# Patient Record
Sex: Female | Born: 1945 | Race: Black or African American | Hispanic: No | Smoking: Former smoker
Health system: Southern US, Community
[De-identification: ages and names within clinical notes are randomized; demographics above are authoritative.]

## PROBLEM LIST (undated history)

## (undated) DIAGNOSIS — M545 Low back pain, unspecified: Secondary | ICD-10-CM

## (undated) DIAGNOSIS — M1990 Unspecified osteoarthritis, unspecified site: Secondary | ICD-10-CM

## (undated) DIAGNOSIS — I10 Essential (primary) hypertension: Secondary | ICD-10-CM

## (undated) DIAGNOSIS — I639 Cerebral infarction, unspecified: Secondary | ICD-10-CM

## (undated) DIAGNOSIS — F329 Major depressive disorder, single episode, unspecified: Secondary | ICD-10-CM

## (undated) DIAGNOSIS — F32A Depression, unspecified: Secondary | ICD-10-CM

## (undated) DIAGNOSIS — G8929 Other chronic pain: Secondary | ICD-10-CM

## (undated) DIAGNOSIS — G459 Transient cerebral ischemic attack, unspecified: Secondary | ICD-10-CM

## (undated) DIAGNOSIS — E119 Type 2 diabetes mellitus without complications: Secondary | ICD-10-CM

## (undated) DIAGNOSIS — E860 Dehydration: Secondary | ICD-10-CM

## (undated) DIAGNOSIS — G309 Alzheimer's disease, unspecified: Secondary | ICD-10-CM

## (undated) DIAGNOSIS — F0280 Dementia in other diseases classified elsewhere without behavioral disturbance: Secondary | ICD-10-CM

## (undated) DIAGNOSIS — E870 Hyperosmolality and hypernatremia: Secondary | ICD-10-CM

## (undated) DIAGNOSIS — F0390 Unspecified dementia without behavioral disturbance: Secondary | ICD-10-CM

## (undated) HISTORY — PX: PARTIAL KNEE ARTHROPLASTY: SHX2174

## (undated) HISTORY — PX: ABDOMINAL HYSTERECTOMY: SHX81

---

## 1997-12-18 ENCOUNTER — Ambulatory Visit (HOSPITAL_COMMUNITY): Admission: RE | Admit: 1997-12-18 | Discharge: 1997-12-18 | Payer: Self-pay | Admitting: *Deleted

## 1999-01-05 ENCOUNTER — Encounter: Payer: Self-pay | Admitting: Obstetrics and Gynecology

## 1999-01-05 ENCOUNTER — Ambulatory Visit (HOSPITAL_COMMUNITY): Admission: RE | Admit: 1999-01-05 | Discharge: 1999-01-05 | Payer: Self-pay | Admitting: Obstetrics and Gynecology

## 2000-01-23 ENCOUNTER — Ambulatory Visit (HOSPITAL_COMMUNITY): Admission: RE | Admit: 2000-01-23 | Discharge: 2000-01-23 | Payer: Self-pay | Admitting: Obstetrics and Gynecology

## 2000-01-23 ENCOUNTER — Encounter: Payer: Self-pay | Admitting: Obstetrics and Gynecology

## 2000-02-01 ENCOUNTER — Encounter: Admission: RE | Admit: 2000-02-01 | Discharge: 2000-02-01 | Payer: Self-pay | Admitting: Obstetrics and Gynecology

## 2000-02-01 ENCOUNTER — Encounter: Payer: Self-pay | Admitting: Obstetrics and Gynecology

## 2014-10-06 ENCOUNTER — Emergency Department (HOSPITAL_COMMUNITY): Payer: Medicare Other

## 2014-10-06 ENCOUNTER — Encounter (HOSPITAL_COMMUNITY): Payer: Self-pay | Admitting: Emergency Medicine

## 2014-10-06 ENCOUNTER — Emergency Department (HOSPITAL_COMMUNITY)
Admission: EM | Admit: 2014-10-06 | Discharge: 2014-10-06 | Disposition: A | Discharge status: Home / Self Care | Payer: Medicare Other | Attending: Emergency Medicine | Admitting: Emergency Medicine

## 2014-10-06 DIAGNOSIS — F0280 Dementia in other diseases classified elsewhere without behavioral disturbance: Secondary | ICD-10-CM | POA: Insufficient documentation

## 2014-10-06 DIAGNOSIS — R4182 Altered mental status, unspecified: Secondary | ICD-10-CM

## 2014-10-06 DIAGNOSIS — G309 Alzheimer's disease, unspecified: Secondary | ICD-10-CM | POA: Diagnosis not present

## 2014-10-06 DIAGNOSIS — I10 Essential (primary) hypertension: Secondary | ICD-10-CM | POA: Insufficient documentation

## 2014-10-06 DIAGNOSIS — F0390 Unspecified dementia without behavioral disturbance: Secondary | ICD-10-CM

## 2014-10-06 HISTORY — DX: Dementia in other diseases classified elsewhere, unspecified severity, without behavioral disturbance, psychotic disturbance, mood disturbance, and anxiety: F02.80

## 2014-10-06 HISTORY — DX: Unspecified dementia, unspecified severity, without behavioral disturbance, psychotic disturbance, mood disturbance, and anxiety: F03.90

## 2014-10-06 HISTORY — DX: Alzheimer's disease, unspecified: G30.9

## 2014-10-06 HISTORY — DX: Essential (primary) hypertension: I10

## 2014-10-06 LAB — COMPREHENSIVE METABOLIC PANEL
ALK PHOS: 46 U/L (ref 38–126)
ALT: 17 U/L (ref 14–54)
AST: 22 U/L (ref 15–41)
Albumin: 4.3 g/dL (ref 3.5–5.0)
Anion gap: 9 (ref 5–15)
BILIRUBIN TOTAL: 0.6 mg/dL (ref 0.3–1.2)
BUN: 25 mg/dL — ABNORMAL HIGH (ref 6–20)
CALCIUM: 10 mg/dL (ref 8.9–10.3)
CHLORIDE: 105 mmol/L (ref 101–111)
CO2: 28 mmol/L (ref 22–32)
Creatinine, Ser: 1.39 mg/dL — ABNORMAL HIGH (ref 0.44–1.00)
GFR calc non Af Amer: 37 mL/min — ABNORMAL LOW (ref >60)
GFR, EST AFRICAN AMERICAN: 43 mL/min — AB (ref >60)
GLUCOSE: 116 mg/dL — AB (ref 65–99)
POTASSIUM: 3.5 mmol/L (ref 3.5–5.1)
Sodium: 142 mmol/L (ref 135–145)
TOTAL PROTEIN: 8.2 g/dL — AB (ref 6.5–8.1)

## 2014-10-06 LAB — URINE MICROSCOPIC-ADD ON

## 2014-10-06 LAB — CBC
HCT: 38.9 % (ref 36.0–46.0)
HEMOGLOBIN: 12.5 g/dL (ref 12.0–15.0)
MCH: 28.2 pg (ref 26.0–34.0)
MCHC: 32.1 g/dL (ref 30.0–36.0)
MCV: 87.8 fL (ref 78.0–100.0)
PLATELETS: 258 10*3/uL (ref 150–400)
RBC: 4.43 MIL/uL (ref 3.87–5.11)
RDW: 13.2 % (ref 11.5–15.5)
WBC: 6.2 10*3/uL (ref 4.0–10.5)

## 2014-10-06 LAB — URINALYSIS, ROUTINE W REFLEX MICROSCOPIC
BILIRUBIN URINE: NEGATIVE
Glucose, UA: NEGATIVE mg/dL
Hgb urine dipstick: NEGATIVE
Ketones, ur: NEGATIVE mg/dL
Nitrite: NEGATIVE
PH: 7 (ref 5.0–8.0)
PROTEIN: NEGATIVE mg/dL
Specific Gravity, Urine: 1.021 (ref 1.005–1.030)
Urobilinogen, UA: 0.2 mg/dL (ref 0.0–1.0)

## 2014-10-06 LAB — CBG MONITORING, ED: Glucose-Capillary: 111 mg/dL — ABNORMAL HIGH (ref 65–99)

## 2014-10-06 LAB — PROTIME-INR
INR: 1 (ref 0.00–1.49)
PROTHROMBIN TIME: 13.4 s (ref 11.6–15.2)

## 2014-10-06 MED ORDER — CEPHALEXIN 500 MG PO CAPS
500.0000 mg | ORAL_CAPSULE | Freq: Once | ORAL | Status: AC
  Administered 2014-10-06: 500 mg via ORAL
  Filled 2014-10-06: qty 1

## 2014-10-06 MED ORDER — CEPHALEXIN 500 MG PO CAPS: 500.0000 mg | ORAL_CAPSULE | Freq: Three times a day (TID) | ORAL | Status: DC

## 2014-10-06 NOTE — ED Notes (Signed)
Pt is alert and talking with visitors at the bedside.  RN provided warm blanket per pt request.  No additional needs at this time.

## 2014-10-06 NOTE — ED Provider Notes (Signed)
CSN: [id number]     Arrival date & time 10/06/14  1114 History   First MD Initiated Contact with Patient 10/06/14 1124     Chief Complaint  Patient presents with  . Altered Mental Status    Level V caveat: Dementia  The history is provided by the patient.   patient presents to the emergency department complaining of altered mental status.  Patient has a history of dementia.  Family reports that she's had gradually increasing dementia and memory issues over the past year but it seems to worsen over the past 7-10 days.  No reports of vomiting or diarrhea.  No reports in change of appetite.  No recent falls or injuries.  Patient denies symptoms or pain at this time.  Past Medical History  Diagnosis Date  . Hypertension   . Alzheimer disease   . Dementia    No past surgical history on file. No family history on file. History  Substance Use Topics  . Smoking status: Not on file  . Smokeless tobacco: Not on file  . Alcohol Use: Not on file   OB History    No data available     Review of Systems  Unable to perform ROS     Allergies  Review of patient's allergies indicates no known allergies.  Home Medications   Prior to Admission medications   Not on File   There were no vitals taken for this visit. Physical Exam  Constitutional: She appears well-developed and well-nourished. No distress.  HENT:  Head: Normocephalic and atraumatic.  Eyes: EOM are normal.  Neck: Normal range of motion.  Cardiovascular: Normal rate, regular rhythm and normal heart sounds.   Pulmonary/Chest: Effort normal and breath sounds normal.  Abdominal: Soft. She exhibits no distension. There is no tenderness.  Musculoskeletal: Normal range of motion.  Neurological: She is alert.  Skin: Skin is warm and dry.  Psychiatric: She has a normal mood and affect. Judgment normal.  Nursing note and vitals reviewed.   ED Course  Procedures (including critical care time) Labs Review Labs Reviewed   COMPREHENSIVE METABOLIC PANEL - Abnormal; Notable for the following:    Glucose, Bld 116 (*)    BUN 25 (*)    Creatinine, Ser 1.39 (*)    Total Protein 8.2 (*)    GFR calc non Af Amer 37 (*)    GFR calc Af Amer 43 (*)    All other components within normal limits  URINALYSIS, ROUTINE W REFLEX MICROSCOPIC (NOT AT [facility]) - Abnormal; Notable for the following:    APPearance CLOUDY (*)    Leukocytes, UA SMALL (*)    All other components within normal limits  URINE MICROSCOPIC-ADD ON - Abnormal; Notable for the following:    Squamous Epithelial / LPF FEW (*)    Bacteria, UA MANY (*)    Casts HYALINE CASTS (*)    All other components within normal limits  CBG MONITORING, ED - Abnormal; Notable for the following:    Glucose-Capillary 111 (*)    All other components within normal limits  URINE CULTURE  CBC  PROTIME-INR    Imaging Review Dg Chest 2 View  10/06/2014   CLINICAL DATA:  69 year old female with altered level of consciousness. History of dementia and hypertension. Initial encounter.  EXAM: CHEST  2 VIEW  COMPARISON:  None.  FINDINGS: Poor inspiration.  Central pulmonary vascular prominence.  Limited evaluation lung bases on frontal view without evidence of infiltrate on lateral view.  Minimal nodularity  posterior to the sternum on the lateral view may represent vessels. Stability can be confirmed on follow-up in 6 months or further evaluated with CT at the current time.  No pneumothorax.  Heart slightly enlarged.  Mildly tortuous aorta.  Mild degenerative changes mid thoracic spine.  Bilateral shoulder joint degenerative changes.  IMPRESSION: Poor inspiration without evidence of segmental consolidation.  Central pulmonary vascular prominence without evidence of pulmonary edema.  Minimal nodularity posterior to the sternum on the lateral view may represent vessels. Stability can be confirmed on follow-up in 6 months or further evaluated with CT at the current time.  Tortuous aorta.    Electronically Signed   By: Lacy Duverney M.D.   On: 10/06/2014 12:43   Ct Head Wo Contrast  10/06/2014   CLINICAL DATA:  Altered mental status. History of Alzheimer's dementia  EXAM: CT HEAD WITHOUT CONTRAST  TECHNIQUE: Contiguous axial images were obtained from the base of the skull through the vertex without intravenous contrast.  COMPARISON:  Brain MRI January 06, 2013  FINDINGS: Moderate diffuse atrophy is stable. There is no intracranial mass, hemorrhage, extra-axial fluid collection, or midline shift. There is evidence of a prior infarct in the mid right frontal lobe, unchanged. There is small vessel disease, patchy, throughout the centra semiovale bilaterally. No new gray-white lesion is identified. No acute infarct appreciable. The bony calvarium appears intact. The mastoid air cells are clear. 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  IMPRESSION: Moderate diffuse atrophy with moderate periventricular small vessel disease in the centra semiovale bilaterally. 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 No acute infarct evident. Chronic right maxillary sinus disease.   Electronically Signed   By: Bretta Bang III M.D.   On: 10/06/2014 12:44  I personally reviewed the imaging tests through PACS system I reviewed available ER/hospitalization records through the EMR    EKG Interpretation None      MDM   Final diagnoses:  None    Overall the patient is well-appearing.  Her urine does show some bacteria.  Urine culture will be sent.  Patient be covered with Keflex.  I suspect however more this is worsening and accelerating dementia.  She was recently placed in a new nursing facility 6-8 weeks ago.  It's likely venous change in location that is caused increasing confusion.  She has scheduled follow-up with neurology.  I recommended that the family speak with primary care physician.  I do not think she needs additional testing at this time.  She is overall very well  appearing.    Azalia Bilis, MD 10/06/14 [phone]

## 2014-10-06 NOTE — ED Notes (Signed)
Pt presents via EMS after report from assisted living facility that she has altered mental status.  EMS reports that she has not taken any of her medications for at least two days per staff at the facility.  Pt has hx of dementia and alzheimers and is currently alert and oriented to self only.  She is unsure of how she feels but states that she is not currently in pain.  Pt has strong bilateral hand grips, strong flexion and extension of feet and legs.  No sensory deficits noted except pt is wearing glasses.

## 2014-10-08 LAB — URINE CULTURE

## 2015-08-27 ENCOUNTER — Emergency Department (HOSPITAL_COMMUNITY): Payer: Medicare Other

## 2015-08-27 ENCOUNTER — Encounter (HOSPITAL_COMMUNITY): Payer: Self-pay

## 2015-08-27 ENCOUNTER — Observation Stay (HOSPITAL_COMMUNITY): Payer: Medicare Other

## 2015-08-27 ENCOUNTER — Inpatient Hospital Stay (HOSPITAL_COMMUNITY)
Admission: EM | Admit: 2015-08-27 | Discharge: 2015-09-02 | DRG: 064 | Disposition: A | Discharge status: Skilled Nursing Facility | Payer: Medicare Other | Attending: Student in an Organized Health Care Education/Training Program | Admitting: Student in an Organized Health Care Education/Training Program

## 2015-08-27 DIAGNOSIS — F0280 Dementia in other diseases classified elsewhere without behavioral disturbance: Secondary | ICD-10-CM | POA: Diagnosis present

## 2015-08-27 DIAGNOSIS — I639 Cerebral infarction, unspecified: Secondary | ICD-10-CM

## 2015-08-27 DIAGNOSIS — F0390 Unspecified dementia without behavioral disturbance: Secondary | ICD-10-CM | POA: Insufficient documentation

## 2015-08-27 DIAGNOSIS — I6339 Cerebral infarction due to thrombosis of other cerebral artery: Secondary | ICD-10-CM | POA: Diagnosis not present

## 2015-08-27 DIAGNOSIS — Z791 Long term (current) use of non-steroidal anti-inflammatories (NSAID): Secondary | ICD-10-CM

## 2015-08-27 DIAGNOSIS — I63412 Cerebral infarction due to embolism of left middle cerebral artery: Principal | ICD-10-CM | POA: Diagnosis present

## 2015-08-27 DIAGNOSIS — G8191 Hemiplegia, unspecified affecting right dominant side: Secondary | ICD-10-CM | POA: Diagnosis present

## 2015-08-27 DIAGNOSIS — G309 Alzheimer's disease, unspecified: Secondary | ICD-10-CM | POA: Diagnosis present

## 2015-08-27 DIAGNOSIS — I10 Essential (primary) hypertension: Secondary | ICD-10-CM | POA: Diagnosis present

## 2015-08-27 DIAGNOSIS — Z7189 Other specified counseling: Secondary | ICD-10-CM | POA: Insufficient documentation

## 2015-08-27 DIAGNOSIS — Z515 Encounter for palliative care: Secondary | ICD-10-CM | POA: Insufficient documentation

## 2015-08-27 DIAGNOSIS — R1314 Dysphagia, pharyngoesophageal phase: Secondary | ICD-10-CM | POA: Diagnosis present

## 2015-08-27 DIAGNOSIS — R414 Neurologic neglect syndrome: Secondary | ICD-10-CM | POA: Diagnosis present

## 2015-08-27 DIAGNOSIS — E119 Type 2 diabetes mellitus without complications: Secondary | ICD-10-CM | POA: Diagnosis present

## 2015-08-27 DIAGNOSIS — E785 Hyperlipidemia, unspecified: Secondary | ICD-10-CM | POA: Diagnosis present

## 2015-08-27 DIAGNOSIS — I619 Nontraumatic intracerebral hemorrhage, unspecified: Secondary | ICD-10-CM | POA: Diagnosis present

## 2015-08-27 DIAGNOSIS — Z66 Do not resuscitate: Secondary | ICD-10-CM | POA: Insufficient documentation

## 2015-08-27 HISTORY — DX: Unspecified osteoarthritis, unspecified site: M19.90

## 2015-08-27 HISTORY — DX: Depression, unspecified: F32.A

## 2015-08-27 HISTORY — DX: Other chronic pain: G89.29

## 2015-08-27 HISTORY — DX: Cerebral infarction, unspecified: I63.9

## 2015-08-27 HISTORY — DX: Low back pain, unspecified: M54.50

## 2015-08-27 HISTORY — DX: Major depressive disorder, single episode, unspecified: F32.9

## 2015-08-27 HISTORY — DX: Type 2 diabetes mellitus without complications: E11.9

## 2015-08-27 HISTORY — DX: Low back pain: M54.5

## 2015-08-27 HISTORY — DX: Transient cerebral ischemic attack, unspecified: G45.9

## 2015-08-27 LAB — DIFFERENTIAL
BASOS ABS: 0 10*3/uL (ref 0.0–0.1)
Basophils Relative: 0 %
EOS ABS: 0.1 10*3/uL (ref 0.0–0.7)
Eosinophils Relative: 1 %
LYMPHS ABS: 1.3 10*3/uL (ref 0.7–4.0)
LYMPHS PCT: 25 %
MONOS PCT: 10 %
Monocytes Absolute: 0.6 10*3/uL (ref 0.1–1.0)
NEUTROS ABS: 3.5 10*3/uL (ref 1.7–7.7)
NEUTROS PCT: 64 %

## 2015-08-27 LAB — URINALYSIS, ROUTINE W REFLEX MICROSCOPIC
Bilirubin Urine: NEGATIVE
GLUCOSE, UA: NEGATIVE mg/dL
HGB URINE DIPSTICK: NEGATIVE
Ketones, ur: NEGATIVE mg/dL
LEUKOCYTES UA: NEGATIVE
Nitrite: NEGATIVE
PH: 7 (ref 5.0–8.0)
Protein, ur: 30 mg/dL — AB
Specific Gravity, Urine: 1.019 (ref 1.005–1.030)

## 2015-08-27 LAB — I-STAT CHEM 8, ED
BUN: 21 mg/dL — AB (ref 6–20)
CREATININE: 1.1 mg/dL — AB (ref 0.44–1.00)
Calcium, Ion: 1.11 mmol/L — ABNORMAL LOW (ref 1.13–1.30)
Chloride: 105 mmol/L (ref 101–111)
GLUCOSE: 132 mg/dL — AB (ref 65–99)
HEMATOCRIT: 38 % (ref 36.0–46.0)
Hemoglobin: 12.9 g/dL (ref 12.0–15.0)
POTASSIUM: 4.3 mmol/L (ref 3.5–5.1)
Sodium: 141 mmol/L (ref 135–145)
TCO2: 27 mmol/L (ref 0–100)

## 2015-08-27 LAB — PROTIME-INR
INR: 1.07 (ref 0.00–1.49)
Prothrombin Time: 14.1 seconds (ref 11.6–15.2)

## 2015-08-27 LAB — COMPREHENSIVE METABOLIC PANEL
ALBUMIN: 3.8 g/dL (ref 3.5–5.0)
ALT: 14 U/L (ref 14–54)
AST: 38 U/L (ref 15–41)
Alkaline Phosphatase: 43 U/L (ref 38–126)
Anion gap: 9 (ref 5–15)
BILIRUBIN TOTAL: 1.1 mg/dL (ref 0.3–1.2)
BUN: 15 mg/dL (ref 6–20)
CO2: 24 mmol/L (ref 22–32)
CREATININE: 1.08 mg/dL — AB (ref 0.44–1.00)
Calcium: 9.5 mg/dL (ref 8.9–10.3)
Chloride: 106 mmol/L (ref 101–111)
GFR calc Af Amer: 58 mL/min — ABNORMAL LOW (ref >60)
GFR, EST NON AFRICAN AMERICAN: 50 mL/min — AB (ref >60)
GLUCOSE: 131 mg/dL — AB (ref 65–99)
POTASSIUM: 4.4 mmol/L (ref 3.5–5.1)
Sodium: 139 mmol/L (ref 135–145)
TOTAL PROTEIN: 6.9 g/dL (ref 6.5–8.1)

## 2015-08-27 LAB — URINE MICROSCOPIC-ADD ON
RBC / HPF: NONE SEEN RBC/hpf (ref 0–5)
WBC, UA: NONE SEEN WBC/hpf (ref 0–5)

## 2015-08-27 LAB — CBC
HEMATOCRIT: 37.3 % (ref 36.0–46.0)
HEMOGLOBIN: 11.7 g/dL — AB (ref 12.0–15.0)
MCH: 27.6 pg (ref 26.0–34.0)
MCHC: 31.4 g/dL (ref 30.0–36.0)
MCV: 88 fL (ref 78.0–100.0)
Platelets: 272 10*3/uL (ref 150–400)
RBC: 4.24 MIL/uL (ref 3.87–5.11)
RDW: 13.6 % (ref 11.5–15.5)
WBC: 5.5 10*3/uL (ref 4.0–10.5)

## 2015-08-27 LAB — APTT: APTT: 24 s (ref 24–37)

## 2015-08-27 LAB — I-STAT TROPONIN, ED: Troponin i, poc: 0.03 ng/mL (ref 0.00–0.08)

## 2015-08-27 LAB — CBG MONITORING, ED: GLUCOSE-CAPILLARY: 131 mg/dL — AB (ref 65–99)

## 2015-08-27 MED ORDER — LORAZEPAM 2 MG/ML IJ SOLN
1.0000 mg | Freq: Once | INTRAMUSCULAR | Status: AC
  Administered 2015-08-27: 1 mg via INTRAVENOUS
  Filled 2015-08-27: qty 1

## 2015-08-27 MED ORDER — OLANZAPINE 10 MG IM SOLR
2.5000 mg | Freq: Once | INTRAMUSCULAR | Status: DC
  Filled 2015-08-27: qty 10

## 2015-08-27 MED ORDER — ENOXAPARIN SODIUM 40 MG/0.4ML ~~LOC~~ SOLN
40.0000 mg | SUBCUTANEOUS | Status: DC
  Administered 2015-08-27: 40 mg via SUBCUTANEOUS
  Filled 2015-08-27: qty 0.4

## 2015-08-27 MED ORDER — ASPIRIN 300 MG RE SUPP
300.0000 mg | Freq: Once | RECTAL | Status: AC
  Administered 2015-08-27: 300 mg via RECTAL
  Filled 2015-08-27: qty 1

## 2015-08-27 MED ORDER — STROKE: EARLY STAGES OF RECOVERY BOOK
Freq: Once | Status: AC
  Administered 2015-08-27: 20:00:00
  Filled 2015-08-27: qty 1

## 2015-08-27 MED ORDER — METOCLOPRAMIDE HCL 5 MG/ML IJ SOLN
10.0000 mg | Freq: Once | INTRAMUSCULAR | Status: AC
  Administered 2015-08-27: 10 mg via INTRAVENOUS
  Filled 2015-08-27: qty 2

## 2015-08-27 MED ORDER — ONDANSETRON HCL 4 MG/2ML IJ SOLN
4.0000 mg | Freq: Once | INTRAMUSCULAR | Status: AC
  Administered 2015-08-27: 4 mg via INTRAVENOUS
  Filled 2015-08-27: qty 2

## 2015-08-27 NOTE — ED Notes (Signed)
MRI notified and estimated time was 1hr.

## 2015-08-27 NOTE — ED Notes (Signed)
Dr. Otelia LimesLindzen at bedside, assessing patient. Family present. Ativan ordered for the MRI. Patient more calm

## 2015-08-27 NOTE — Consult Note (Signed)
Requesting Physician: Dr. Verdie Mosher    Chief Complaint: Stroke  HPI:                                                                                                                                         Glenda Coleman is an 70 y.o. female.  History obtained from EMS: "she was at her baseline mental status around 5 PM yesterday afternoon. Upon waking up this morning staff members found that she was completely flaccid on her right side. EMS was called and upon their arrival had 3-4 episodes of nonbilious nonbloody emesis. Slowly moving her right side during EMS transport. According to nursing facility she is nonverbal and ambulatory at baseline." Currently she is moving her right side but still has left gaze preference.   Date last known well: yesterday Time last known well: Unable to determine tPA Given: No: out of time window      Modified Rankin: Rankin Score=4   Past Medical History  Diagnosis Date  . Hypertension   . Alzheimer disease   . Dementia     History reviewed. No pertinent past surgical history.  No family history on file. Social History:  has no tobacco, alcohol, and drug history on file.  Allergies: No Known Allergies  Medications:                                                                                                                           No current facility-administered medications for this encounter.   Current Outpatient Prescriptions  Medication Sig Dispense Refill  . acetaminophen (TYLENOL) 500 MG tablet Take 500 mg by mouth every 6 (six) hours as needed for moderate pain.    Marland Kitchen ALPRAZolam (XANAX) 0.25 MG tablet Take 0.25 mg by mouth every 6 (six) hours as needed for anxiety.    Marland Kitchen amoxicillin (AMOXIL) 500 MG capsule Take 2,000 mg by mouth See admin instructions. Take 4 capsules prior to dental appts    . Biotin 5 MG TABS Take 5 mg by mouth daily.    . calcium-vitamin D (OSCAL WITH D) 500-200 MG-UNIT tablet Take 1 tablet by mouth 2 (two) times daily.    Marland Kitchen  donepezil (ARICEPT) 10 MG tablet Take 20 mg by mouth daily.   3  . hydrochlorothiazide (HYDRODIURIL) 25 MG tablet Take 25 mg by mouth daily.  1  . HYDROcodone-acetaminophen (NORCO/VICODIN)  5-325 MG tablet Take 1 tablet by mouth 3 (three) times daily as needed for moderate pain.    Marland Kitchen. lisinopril (PRINIVIL,ZESTRIL) 5 MG tablet Take 5 mg by mouth daily.  3  . memantine (NAMENDA) 10 MG tablet Take 10 mg by mouth daily.   6  . Multiple Vitamin (DAILY VITE PO) Take 1 tablet by mouth daily.    . naproxen (NAPROSYN) 500 MG tablet Take 500 mg by mouth 2 (two) times daily with a meal.    . Omega-3 Fatty Acids (FISH OIL) 1000 MG CAPS Take 2,000 mg by mouth daily.    . sertraline (ZOLOFT) 100 MG tablet Take 100 mg by mouth daily.    . simvastatin (ZOCOR) 40 MG tablet Take 40 mg by mouth daily.  2     ROS:                                                                                                                                       History obtained from unobtainable from patient due to language barrier   Neurologic Examination:                                                                                                      Blood pressure 171/97, pulse 55, temperature 97.4 F (36.3 C), temperature source Axillary, resp. rate 17, SpO2 100 %.  HEENT-  Normocephalic, no lesions, without obvious abnormality.  Normal external eye and conjunctiva.  Normal TM's bilaterally.  Normal auditory canals and external ears. Normal external nose, mucus membranes and septum.  Normal pharynx. Cardiovascular- S1, S2 normal, pulses palpable throughout   Lungs- chest clear, no wheezing, rales, normal symmetric air entry Abdomen- soft, non-tender; bowel sounds normal; no masses,  no organomegaly Extremities- no edema Lymph-no adenopathy palpable Musculoskeletal-no joint tenderness, deformity or swelling Skin-warm and dry, no hyperpigmentation, vitiligo, or suspicious lesions  Neurological Examination Mental  Status: Alert, non-vocal and does not follow commands.  Cranial Nerves: II: No blink to threat on the right.  pupils equal, round, reactive to light and accommodation III,IV, VI: ptosis not present, left gaze preference and does not cross midline.  V,VII: face symmetric, facial light touch sensation normal bilaterally VIII: hearing normal bilaterally IX,X: uvula rises symmetrically XI: bilateral shoulder shrug XII: midline tongue extension Motor: Withdraws all extremities strongly from noxious stimuli.  Sensory: withdraws from pain in all extremities Deep Tendon Reflexes: 2+ and symmetric throughout Plantars: Right: downgoing   Left: downgoing Cerebellar: Unable to assess  due to inability to follow commands Gait: Unable to test.    Lab Results: Basic Metabolic Panel:  Recent Labs Lab 08/27/15 1010 08/27/15 1021  NA 139 141  K 4.4 4.3  CL 106 105  CO2 24  --   GLUCOSE 131* 132*  BUN 15 21*  CREATININE 1.08* 1.10*  CALCIUM 9.5  --     Liver Function Tests:  Recent Labs Lab 08/27/15 1010  AST 38  ALT 14  ALKPHOS 43  BILITOT 1.1  PROT 6.9  ALBUMIN 3.8   No results for input(s): LIPASE, AMYLASE in the last 168 hours. No results for input(s): AMMONIA in the last 168 hours.  CBC:  Recent Labs Lab 08/27/15 1010 08/27/15 1021  WBC 5.5  --   NEUTROABS 3.5  --   HGB 11.7* 12.9  HCT 37.3 38.0  MCV 88.0  --   PLT 272  --     Cardiac Enzymes: No results for input(s): CKTOTAL, CKMB, CKMBINDEX, TROPONINI in the last 168 hours.  Lipid Panel: No results for input(s): CHOL, TRIG, HDL, CHOLHDL, VLDL, LDLCALC in the last 168 hours.  CBG:  Recent Labs Lab 08/27/15 1004  GLUCAP 131*    Microbiology: Results for orders placed or performed during the hospital encounter of 10/06/14  Urine culture     Status: None   Collection Time: 10/06/14 12:48 PM  Result Value Ref Range Status   Specimen Description URINE, CLEAN CATCH  Final   Special Requests NONE  Final    Culture   Final    MULTIPLE SPECIES PRESENT, SUGGEST RECOLLECTION Performed at [facility]    Report Status 10/08/2014 FINAL  Final    Coagulation Studies:  Recent Labs  08/27/15 1010  LABPROT 14.1  INR 1.07    Imaging: Ct Head Wo Contrast  08/27/2015  CLINICAL DATA:  Right-sided weakness.  Alzheimer's dementia. EXAM: CT HEAD WITHOUT CONTRAST TECHNIQUE: Contiguous axial images were obtained from the base of the skull through the vertex without intravenous contrast. COMPARISON:  October 06, 2014 FINDINGS: Moderate diffuse atrophy is stable. There is no intracranial mass, hemorrhage, extra-axial fluid collection, or midline shift. There is widespread small vessel disease in the centra semiovale bilaterally. There is a stable prior infarct mid right frontal lobe. No acute infarct is evident. The bony calvarium appears intact. The mastoid air cells are clear. There is opacification of most of the right maxillary antrum with mild expansion of the right maxillary antrum medially. There is a retention cyst in the inferior left maxillary antrum. There is mucosal thickening in several ethmoid air cells bilaterally. No intraorbital lesions are evident. IMPRESSION: Atrophy with extensive periventricular small vessel disease. Prior infarct mid right frontal lobe. No acute infarct evident. No hemorrhage or mass effect. There is paranasal sinus disease at several sites. Question mucocele right maxillary sinus region, given mild medial expansion of the right maxillary antrum. Electronically Signed   By: Bretta Bang III M.D.   On: 08/27/2015 10:44    History and examination documented by Felicie Morn PA-C, [facility], [phone]  Assessment: 1. 70 y.o. 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 Stroke Risk Factors - hypertension  2. CT head reveals atrophy with extensive periventricular small vessel disease. A prior infarct in the mid right frontal lobe is noted.  No acute infarct is evident.  3. Alzheimer's dementia.   Impression: 1. HgbA1c, fasting lipid panel 2. MRI, MRA  of the brain without contrast 3. PT consult, OT consult, Speech consult  4. Echocardiogram 5. Carotid dopplers 6. Prophylactic therapy- Antiplatelet med: Aspirin - dose 325 mg daily 7. Continue simvastatin. 8. Telemetry monitoring 9. Frequent neuro checks 10. NPO until passes stroke swallow screen 11. Continue Aricept and Namenda.  12. Taper off Xanax. This medication likely worsens her cognition.  13.  Please page stroke NP  Or  PA  Or MD from 8am - 4 pm  as this patient from this time will be  followed by the stroke.   You can look them up on [email or web address]  Password [email or web address]  Electronically signed: Dr. Caryl Pina 08/27/2015, 12:13 PM

## 2015-08-27 NOTE — ED Notes (Signed)
Called pharmacy.  They will verify meds so RN can pull asa.

## 2015-08-27 NOTE — H&P (Signed)
Date: 08/27/2015               Patient Name:  Glenda Coleman MRN: [id number]  DOB: 1945/07/03 Age / Sex: 70 y.o., female   PCP: No primary care provider on file.         Medical Service: Internal Medicine Teaching Service         Attending Physician: Dr. Earl Lagos, MD    First Contact: Dr. Dimple Casey Pager: [phone]  Second Contact: Dr. Danella Penton Pager: [phone]       After Hours (After 5p/  First Contact Pager: [phone]  weekends / holidays): Second Contact Pager: [phone]   Chief Complaint: Right sided weakness, emesis, confusion  History of Present Illness: 70 y/o woman with PMHx significant for hypertension and advanced alzheimers dementia presents to the ED from her senior nursing facility after waking up this morning with complete right sided weakness and hemineglect. She was last seen to be at her baseline status around 5pm yesterday evening. No preceding history of illness or new complaints. EMS was called and on responding also observed several episodes of nonbilious nonbloody vomiting. Since arrival to the ED she has been confused and only intermittently verbal. It is unclear how different this is than her reported baseline of ambulatory but mostly nonverbal. Her right sided strength was found to be normal in reflexive movements but not used in purposeful manner or on commands. CT head was obtained showing no acute infarct or bleed but old right frontal infarct. She did have active vomiting in the ED and was given zofran with improvement. Other initial laboratory tests unremarkable and mildly hypertensive.  Meds: Current Facility-Administered Medications  Medication Dose Route Frequency Provider Last Rate Last Dose  .  stroke: mapping our early stages of recovery book   Does not apply Once Denton Brick, MD      . enoxaparin (LOVENOX) injection 40 mg  40 mg Subcutaneous Q24H Denton Brick, MD      . OLANZapine [facility]) injection 2.5 mg  2.5 mg Intramuscular Once Lavera Guise, MD        Current Outpatient Prescriptions  Medication Sig Dispense Refill  . acetaminophen (TYLENOL) 500 MG tablet Take 500 mg by mouth every 6 (six) hours as needed for moderate pain.    Marland Kitchen ALPRAZolam (XANAX) 0.25 MG tablet Take 0.25 mg by mouth every 6 (six) hours as needed for anxiety.    Marland Kitchen amoxicillin (AMOXIL) 500 MG capsule Take 2,000 mg by mouth See admin instructions. Take 4 capsules prior to dental appts    . Biotin 5 MG TABS Take 5 mg by mouth daily.    . calcium-vitamin D (OSCAL WITH D) 500-200 MG-UNIT tablet Take 1 tablet by mouth 2 (two) times daily.    Marland Kitchen donepezil (ARICEPT) 10 MG tablet Take 20 mg by mouth daily.   3  . hydrochlorothiazide (HYDRODIURIL) 25 MG tablet Take 25 mg by mouth daily.  1  . HYDROcodone-acetaminophen (NORCO/VICODIN) 5-325 MG tablet Take 1 tablet by mouth 3 (three) times daily as needed for moderate pain.    Marland Kitchen lisinopril (PRINIVIL,ZESTRIL) 5 MG tablet Take 5 mg by mouth daily.  3  . memantine (NAMENDA) 10 MG tablet Take 10 mg by mouth daily.   6  . Multiple Vitamin (DAILY VITE PO) Take 1 tablet by mouth daily.    . naproxen (NAPROSYN) 500 MG tablet Take 500 mg by mouth 2 (two) times daily with a meal.    . Omega-3 Fatty Acids (  FISH OIL) 1000 MG CAPS Take 2,000 mg by mouth daily.    . sertraline (ZOLOFT) 100 MG tablet Take 100 mg by mouth daily.    . simvastatin (ZOCOR) 40 MG tablet Take 40 mg by mouth daily.  2    Allergies: Allergies as of 08/27/2015  . (No Known Allergies)   Past Medical History  Diagnosis Date  . Hypertension   . Alzheimer disease   . Dementia    History reviewed. No pertinent past surgical history. No family history on file. Social History   Social History  . Marital Status: Divorced    Spouse Name: N/A  . Number of Children: N/A  . Years of Education: N/A   Occupational History  . Not on file.   Social History Main Topics  . Smoking status: Not on file  . Smokeless tobacco: Not on file  . Alcohol Use: Not on file  .  Drug Use: Not on file  . Sexual Activity: Not on file   Other Topics Concern  . Not on file   Social History Narrative    Review of Systems: Review of Systems  Unable to perform ROS: mental status change  Patient mostly nonverbal, not following commands, and not oriented  Physical Exam: Blood pressure 166/95, pulse 55, temperature 97.4 F (36.3 C), temperature source Axillary, resp. rate 17, SpO2 100 %. GENERAL- Disoriented, awake spontaneously, not in acute distress HEENT- Corneas cloudly b/l, fluid eye movements but inattentive to right visual fields, no carotid bruits CARDIAC- RRR, no murmurs, rubs or gallops. RESP- Anterior breath sounds clear ABDOMEN- Soft, nontender, no guarding or rebound, normoactive bowel sounds present NEURO- Exam limited by patient noncooperative, strength apparently 5/5 b/l, 2+ reflexes b/l, total right hemineglect to touch and visual fields EXTREMITIES- pulse 2+, symmetric, no pedal edema SKIN- Warm, dry, No rash or lesion PSYCH- Easily startled, unable to answer questions coherently  Lab results: Basic Metabolic Panel:  Recent Labs  16/10/96 1010 08/27/15 1021  NA 139 141  K 4.4 4.3  CL 106 105  CO2 24  --   GLUCOSE 131* 132*  BUN 15 21*  CREATININE 1.08* 1.10*  CALCIUM 9.5  --    Liver Function Tests:  Recent Labs  08/27/15 1010  AST 38  ALT 14  ALKPHOS 43  BILITOT 1.1  PROT 6.9  ALBUMIN 3.8   CBC:  Recent Labs  08/27/15 1010 08/27/15 1021  WBC 5.5  --   NEUTROABS 3.5  --   HGB 11.7* 12.9  HCT 37.3 38.0  MCV 88.0  --   PLT 272  --      Recent Labs  08/27/15 1004  GLUCAP 131*   Coagulation:  Recent Labs  08/27/15 1010  LABPROT 14.1  INR 1.07   Urinalysis:  Recent Labs  08/27/15 1109  COLORURINE YELLOW  LABSPEC 1.019  PHURINE 7.0  GLUCOSEU NEGATIVE  HGBUR NEGATIVE  BILIRUBINUR NEGATIVE  KETONESUR NEGATIVE  PROTEINUR 30*  NITRITE NEGATIVE  LEUKOCYTESUR NEGATIVE    Imaging results:  Ct  Head Wo Contrast  08/27/2015  CLINICAL DATA:  Right-sided weakness.  Alzheimer's dementia. EXAM: CT HEAD WITHOUT CONTRAST TECHNIQUE: Contiguous axial images were obtained from the base of the skull through the vertex without intravenous contrast. COMPARISON:  October 06, 2014 FINDINGS: Moderate diffuse atrophy is stable. There is no intracranial mass, hemorrhage, extra-axial fluid collection, or midline shift. There is widespread small vessel disease in the centra semiovale bilaterally. There is a stable prior infarct mid right  frontal lobe. No acute infarct is evident. The bony calvarium appears intact. The mastoid air cells are clear. There is opacification of most of the right maxillary antrum with mild expansion of the right maxillary antrum medially. There is a retention cyst in the inferior left maxillary antrum. There is mucosal thickening in several ethmoid air cells bilaterally. No intraorbital lesions are evident. IMPRESSION: Atrophy with extensive periventricular small vessel disease. Prior infarct mid right frontal lobe. No acute infarct evident. No hemorrhage or mass effect. There is paranasal sinus disease at several sites. Question mucocele right maxillary sinus region, given mild medial expansion of the right maxillary antrum. Electronically Signed   By: Bretta BangWilliam  Woodruff III M.D.   On: 08/27/2015 10:44   Other results: EKG: sinus rhythm, possibly enlarged LA, nonspecific ST and T waves changes.  Assessment & Plan by Problem: Acute cerebrovascular accident (CVA): Patient presenting with new total right hemineglect in the absence of obvious motor weakness. Her mental status is disoriented and nonverbal but unclear how different from her baseline dementia. Evidence of an old R frontal stroke on CT head with no acute bleeding. She is mildly hypertensive and on statin for dyslipidemia but not on antiplatelet agents or anticoagulation. -Neurology service consulted, recommendations appreciated -Start  antiplatelet therapy ASA 325mg  -MRI/MRA head -Carotid US -TTE -PT consult, OT consult, SLP consult -Cardiac monitoring  Hypertension: Mild to moderate HTN on HCTZ 25mg  and lisinopril 5mg . SBP in 160s on presentation. Permissive HTN at this time in setting of suspicion for acute ischemic CVA. -Hold home lisinopril, HCTZ  Alzheimers dementia: From chart review and family appears advanced disease. She is confused, easily startled, not following commands, minimally verbal. She is is on aricept and namenda PTA for this. She is on xanax 0.25mg  q6hrs PRN for anxiety/agitation, and sertraline 100mg  PTA. Also on Norco 5 TID PRN for chronic pain and naproxen. -Hold meds while initially NPO -Will reduce sedative meds initially and restart as needed  - May require additional sedation overnight or to tolerate additional CVA workup  Dyslipidemia: On Zocor 40mg  daily PTA. In setting of probable CVA will want to restart when tolerating medications for risk factor modification.  FULL CODE Diet: NPO pending SLP eval VTE ppx: [place] enoxparin  Dispo: Disposition is deferred at this time, awaiting improvement of current medical problems. Anticipated discharge in approximately 1-2 day(s).   The patient does not know have a current PCP (No primary care provider on file.) and [facility] after [facility].  The patient does have transportation limitations that hinder transportation to clinic appointments.  Signed: Fuller Planhristopher W Falan Hensler, MD 08/27/2015, 3:36 PM

## 2015-08-27 NOTE — ED Provider Notes (Signed)
CSN: [id number]     Arrival date & time 08/27/15  [id number] History   First MD Initiated Contact with Patient 08/27/15 1012     Chief Complaint  Patient presents with  . Cerebrovascular Accident     (Consider location/radiation/quality/duration/timing/severity/associated sxs/prior Treatment) HPI 70 year old female who presents with right-sided weakness. Level V caveat as patient not verbal. History of hypertension and Alzheimer's dementia. Currently residing in a senior nursing home.  History obtained from EMS and states that she was at her baseline mental status around 5 PM yesterday afternoon. Upon waking up this morning staff members found that she was completely flaccid on her right side. EMS was called and upon their arrival had 3-4 episodes of nonbilious nonbloody emesis. Slowly moving her right side during EMS transport. According to nursing facility she is nonverbal and ambulatory at baseline. Point-of-care glucose 120 on arrival.     Past Medical History  Diagnosis Date  . Hypertension   . Alzheimer disease   . Dementia    History reviewed. No pertinent past surgical history. No family history on file. Social History  Substance Use Topics  . Smoking status: None  . Smokeless tobacco: None  . Alcohol Use: None   OB History    No data available     Review of Systems  Unable to perform ROS: Patient nonverbal      Allergies  Review of patient's allergies indicates no known allergies.  Home Medications   Prior to Admission medications   Medication Sig Start Date End Date Taking? Authorizing Provider  acetaminophen (TYLENOL) 500 MG tablet Take 500 mg by mouth every 6 (six) hours as needed for moderate pain.   Yes Historical Provider, MD  ALPRAZolam (XANAX) 0.25 MG tablet Take 0.25 mg by mouth every 6 (six) hours as needed for anxiety.   Yes Historical Provider, MD  amoxicillin (AMOXIL) 500 MG capsule Take 2,000 mg by mouth See admin instructions. Take 4 capsules prior to  dental appts   Yes Historical Provider, MD  Biotin 5 MG TABS Take 5 mg by mouth daily.   Yes Historical Provider, MD  calcium-vitamin D (OSCAL WITH D) 500-200 MG-UNIT tablet Take 1 tablet by mouth 2 (two) times daily.   Yes Historical Provider, MD  donepezil (ARICEPT) 10 MG tablet Take 20 mg by mouth daily.  09/24/14  Yes Historical Provider, MD  hydrochlorothiazide (HYDRODIURIL) 25 MG tablet Take 25 mg by mouth daily. 08/28/14  Yes Historical Provider, MD  HYDROcodone-acetaminophen (NORCO/VICODIN) 5-325 MG tablet Take 1 tablet by mouth 3 (three) times daily as needed for moderate pain.   Yes Historical Provider, MD  lisinopril (PRINIVIL,ZESTRIL) 5 MG tablet Take 5 mg by mouth daily. 09/08/14  Yes Historical Provider, MD  memantine (NAMENDA) 10 MG tablet Take 10 mg by mouth daily.  09/30/14  Yes Historical Provider, MD  Multiple Vitamin (DAILY VITE PO) Take 1 tablet by mouth daily.   Yes Historical Provider, MD  naproxen (NAPROSYN) 500 MG tablet Take 500 mg by mouth 2 (two) times daily with a meal.   Yes Historical Provider, MD  Omega-3 Fatty Acids (FISH OIL) 1000 MG CAPS Take 2,000 mg by mouth daily.   Yes Historical Provider, MD  sertraline (ZOLOFT) 100 MG tablet Take 100 mg by mouth daily.   Yes Historical Provider, MD  simvastatin (ZOCOR) 40 MG tablet Take 40 mg by mouth daily. 09/15/14  Yes Historical Provider, MD   BP 171/97 mmHg  Pulse 55  Temp(Src) 97.4 F (36.3 C) (Axillary)  Resp 17  SpO2 100% Physical Exam  Physical Exam  Nursing note and vitals reviewed. Constitutional: Nonn-toxic, and in no acute distress Head: Normocephalic and atraumatic.  Mouth/Throat: Oropharynx is clear and dry.  Neck: Normal range of motion. Neck supple.  Cardiovascular: Normal rate and regular rhythm. No edema  Pulmonary/Chest: Effort normal and breath sounds normal.  Abdominal: Soft. There is no tenderness. There is no rebound and no guarding.  Musculoskeletal: No deformities Neurological: Alert, no  facial droop, occasional fluent speech ("i don't know where I am, i don't know what is going on"), moving left side spontaneously, full hand grip on right side to painful stimuli but less spontaneous movement, complete right side neglect Skin: Skin is warm and dry.  Psychiatric: Cooperative   ED Course  Procedures (including critical care time) Labs Review Labs Reviewed  CBC - Abnormal; Notable for the following:    Hemoglobin 11.7 (*)    All other components within normal limits  COMPREHENSIVE METABOLIC PANEL - Abnormal; Notable for the following:    Glucose, Bld 131 (*)    Creatinine, Ser 1.08 (*)    GFR calc non Af Amer 50 (*)    GFR calc Af Amer 58 (*)    All other components within normal limits  URINALYSIS, ROUTINE W REFLEX MICROSCOPIC (NOT AT [facility]) - Abnormal; Notable for the following:    Protein, ur 30 (*)    All other components within normal limits  URINE MICROSCOPIC-ADD ON - Abnormal; Notable for the following:    Squamous Epithelial / LPF 0-5 (*)    Bacteria, UA RARE (*)    All other components within normal limits  CBG MONITORING, ED - Abnormal; Notable for the following:    Glucose-Capillary 131 (*)    All other components within normal limits  I-STAT CHEM 8, ED - Abnormal; Notable for the following:    BUN 21 (*)    Creatinine, Ser 1.10 (*)    Glucose, Bld 132 (*)    Calcium, Ion 1.11 (*)    All other components within normal limits  PROTIME-INR  APTT  DIFFERENTIAL  I-STAT TROPOININ, ED  CBG MONITORING, ED    Imaging Review Ct Head Wo Contrast  08/27/2015  CLINICAL DATA:  Right-sided weakness.  Alzheimer's dementia. EXAM: CT HEAD WITHOUT CONTRAST TECHNIQUE: Contiguous axial images were obtained from the base of the skull through the vertex without intravenous contrast. COMPARISON:  October 06, 2014 FINDINGS: Moderate diffuse atrophy is stable. There is no intracranial mass, hemorrhage, extra-axial fluid collection, or midline shift. There is widespread small  vessel disease in the centra semiovale bilaterally. There is a stable prior infarct mid right frontal lobe. No acute infarct is evident. The bony calvarium appears intact. The mastoid air cells are clear. There is opacification of most of the right maxillary antrum with mild expansion of the right maxillary antrum medially. There is a retention cyst in the inferior left maxillary antrum. There is mucosal thickening in several ethmoid air cells bilaterally. No intraorbital lesions are evident. IMPRESSION: Atrophy with extensive periventricular small vessel disease. Prior infarct mid right frontal lobe. No acute infarct evident. No hemorrhage or mass effect. There is paranasal sinus disease at several sites. Question mucocele right maxillary sinus region, given mild medial expansion of the right maxillary antrum. Electronically Signed   By: Bretta Bang III M.D.   On: 08/27/2015 10:44   I have personally reviewed and evaluated these images and lab results as part of my medical decision-making.  EKG Interpretation   Date/Time:  Friday August 27 2015 10:02:04 EDT Ventricular Rate:  71 PR Interval:  123 QRS Duration: 87 QT Interval:  433 QTC Calculation: 471 R Axis:   18 Text Interpretation:  Sinus rhythm Probable left atrial enlargement  Nonspecific T abnormalities, lateral leads Baseline wander in lead(s) V2  No prior EKG for comparison  Confirmed by Ivanna Kocak MD, Annabelle Harman ([id number]) on  08/27/2015 10:21:10 AM Also confirmed by Verdie Mosher MD, Delrose Rohwer [phone]), editor Stout  CT, Jola Babinski [phone])  on 08/27/2015 10:47:08 AM      MDM   Final diagnoses:  CVA (cerebral infarction)    Presentation concerning for acute CVA with right side hemineglect and right sided weakness. Outside of tpa window as last normal 5 PM yesterday. CT head with old stroke but No intracranial hemorrhage. During ED course, has slowly improving deficits. Discussed with neurology who will evaluate patient. MRI, MRA of the head and neck are  ordered. She is admitted to the internal medicine teaching service under Dr. Horris Latino for stroke w/u.    Lavera Guise, MD 08/27/15 [phone]

## 2015-08-27 NOTE — ED Notes (Signed)
Brought recliner in room for patient to sit in; warm blankets placed on patient as well; visitor at bedside

## 2015-08-27 NOTE — ED Notes (Signed)
Patient very agitated and not following commands. Still vomiting after zofran. MD made aware

## 2015-08-27 NOTE — ED Notes (Addendum)
Pt[facility]. It was reported that she was normal at 5pm the night before and when they woke her up this AM her R side was flacid. She threw up 3-4 times and EMS gave Zofran. Right arm and leg moving now. At baseline she is non verbal and ambulatory independently. EKG was unremarkable, NSR, CBG 120. 94% RA, and BP 160/40 with EMS

## 2015-08-27 NOTE — ED Notes (Signed)
Family at bedside, patient doing much better

## 2015-08-27 NOTE — ED Notes (Signed)
Pt pulled out IV.  20 to LAC placed.  Pt to MRI - family accompanied. Requested pt be placed on pulse ox during MRI.  [place] ordered.

## 2015-08-27 NOTE — ED Notes (Signed)
MRI called and stated pt not tolerating MRI.  Neuro paged.

## 2015-08-27 NOTE — H&P (Signed)
Date: 08/27/2015               Patient Name:  Glenda Coleman MRN: [id number]  DOB: Aug 21, 1945 Age / Sex: 70 y.o., female   PCP: No primary care provider on file.              Medical Service: Internal Medicine Teaching Service              Attending Physician: Dr. Earl Lagos, MD    First Contact: Mammie Lorenzo, MS 3 Pager: [phone]  Second Contact: Dr. Sheliah Hatch Pager: [phone]  Third Contact Dr. Gara Kroner Pager: [phone]       After Hours (After 5p/  First Contact Pager: [phone]  weekends / holidays): Second Contact Pager: [phone]   Chief Complaint: Flaccid right extremities  History of Present Illness:  Glenda Coleman is a 70 year old woman with past medical history significant for Alzheimer's disease and hypertension who presents for flaccid right side. When staff at her nursing facility found her this morning she was flaccid on her right side, after which EMS was called. She was last known to be at baseline, which is nonverbal and ambulatory, at 5 PM last night. During transport and upon arrival, she had multiple episodes of nonbilious, nonbloody emesis.  In the ED, a head CT showed evidence of prior infarct in the mid-right frontal lobe but no evidence of acute infarct. Her EKG and POC glucose were unremarkable. She was given Zofran for her emesis, though she still had another episode of emesis. She was also given ativan for agitation. She was able to move her right arm and leg when she arrived. Neurology consult suggested left parietal occipital infarct likely. She was not following commands or responding to questions during her initial evaluation.  Meds: Current Facility-Administered Medications  Medication Dose Route Frequency Provider Last Rate Last Dose  .  stroke: mapping our early stages of recovery book   Does not apply Once Denton Brick, MD      . aspirin suppository 300 mg  300 mg Rectal Once Denton Brick, MD      . enoxaparin (LOVENOX) injection  40 mg  40 mg Subcutaneous Q24H Denton Brick, MD      . LORazepam (ATIVAN) injection 1 mg  1 mg Intravenous Once Caryl Pina, MD       Current Outpatient Prescriptions  Medication Sig Dispense Refill  . acetaminophen (TYLENOL) 500 MG tablet Take 500 mg by mouth every 6 (six) hours as needed for moderate pain.    Marland Kitchen ALPRAZolam (XANAX) 0.25 MG tablet Take 0.25 mg by mouth every 6 (six) hours as needed for anxiety.    Marland Kitchen amoxicillin (AMOXIL) 500 MG capsule Take 2,000 mg by mouth See admin instructions. Take 4 capsules prior to dental appts    . Biotin 5 MG TABS Take 5 mg by mouth daily.    . calcium-vitamin D (OSCAL WITH D) 500-200 MG-UNIT tablet Take 1 tablet by mouth 2 (two) times daily.    Marland Kitchen donepezil (ARICEPT) 10 MG tablet Take 20 mg by mouth daily.   3  . hydrochlorothiazide (HYDRODIURIL) 25 MG tablet Take 25 mg by mouth daily.  1  . HYDROcodone-acetaminophen (NORCO/VICODIN) 5-325 MG tablet Take 1 tablet by mouth 3 (three) times daily as needed for moderate pain.    Marland Kitchen lisinopril (PRINIVIL,ZESTRIL) 5 MG tablet Take 5 mg by mouth daily.  3  . memantine (NAMENDA) 10 MG tablet Take 10 mg by mouth daily.  6  . Multiple Vitamin (DAILY VITE PO) Take 1 tablet by mouth daily.    . naproxen (NAPROSYN) 500 MG tablet Take 500 mg by mouth 2 (two) times daily with a meal.    . Omega-3 Fatty Acids (FISH OIL) 1000 MG CAPS Take 2,000 mg by mouth daily.    . sertraline (ZOLOFT) 100 MG tablet Take 100 mg by mouth daily.    . simvastatin (ZOCOR) 40 MG tablet Take 40 mg by mouth daily.  2    Allergies: Allergies as of 08/27/2015  . (No Known Allergies)   Past Medical History  Diagnosis Date  . Hypertension   . Alzheimer disease   . Dementia    History reviewed. No pertinent past surgical history. No family history on file. Social History   Social History  . Marital Status: Divorced    Spouse Name: N/A  . Number of Children: N/A  . Years of Education: N/A   Occupational History  . Not on  file.   Social History Main Topics  . Smoking status: Not on file  . Smokeless tobacco: Not on file  . Alcohol Use: Not on file  . Drug Use: Not on file  . Sexual Activity: Not on file   Other Topics Concern  . Not on file   Social History Narrative    Review of Systems: Review of systems not obtained due to patient factors.  Physical Exam: Blood pressure 166/95, pulse 55, temperature 97.4 F (36.3 C), temperature source Axillary, resp. rate 17, SpO2 100 %. General: Laying in bed. Agitated and not responsive to commands. HEENT: PERRL. Moist mucous membranes. CV: RRR. Normal S1/S2. No m/r/g. Pulm: CTAB. Abdomen: Soft, nontender, nondistended. Positive bowel sounds. Extremities: No peripheral edema. 2+ DP/PT and radial pulses. Neuro: 2+ deep tendon reflexes throughout. Left gaze. Responds to stimuli in all extremities. Does not recognize location of right-sided stimuli. Motor ability retained bilaterally.  Lab results: Basic Metabolic Panel:  Recent Labs  16/10/96 1010 08/27/15 1021  NA 139 141  K 4.4 4.3  CL 106 105  CO2 24  --   GLUCOSE 131* 132*  BUN 15 21*  CREATININE 1.08* 1.10*  CALCIUM 9.5  --    Liver Function Tests:  Recent Labs  08/27/15 1010  AST 38  ALT 14  ALKPHOS 43  BILITOT 1.1  PROT 6.9  ALBUMIN 3.8   CBC:  Recent Labs  08/27/15 1010 08/27/15 1021  WBC 5.5  --   NEUTROABS 3.5  --   HGB 11.7* 12.9  HCT 37.3 38.0  MCV 88.0  --   PLT 272  --    Cardiac Enzymes: Troponin (Point of Care Test)  Recent Labs  08/27/15 1019  TROPIPOC 0.03   CBG:  Recent Labs  08/27/15 1004  GLUCAP 131*   Coagulation:  Recent Labs  08/27/15 1010  LABPROT 14.1  INR 1.07   Urinalysis:  Recent Labs  08/27/15 1109  COLORURINE YELLOW  LABSPEC 1.019  PHURINE 7.0  GLUCOSEU NEGATIVE  HGBUR NEGATIVE  BILIRUBINUR NEGATIVE  KETONESUR NEGATIVE  PROTEINUR 30*  NITRITE NEGATIVE  LEUKOCYTESUR NEGATIVE    Imaging results:  Ct Head Wo  Contrast  08/27/2015  CLINICAL DATA:  Right-sided weakness.  Alzheimer's dementia. EXAM: CT HEAD WITHOUT CONTRAST TECHNIQUE: Contiguous axial images were obtained from the base of the skull through the vertex without intravenous contrast. COMPARISON:  October 06, 2014 FINDINGS: Moderate diffuse atrophy is stable. There is no intracranial mass, hemorrhage, extra-axial fluid collection,  or midline shift. There is widespread small vessel disease in the centra semiovale bilaterally. There is a stable prior infarct mid right frontal lobe. No acute infarct is evident. The bony calvarium appears intact. The mastoid air cells are clear. There is opacification of most of the right maxillary antrum with mild expansion of the right maxillary antrum medially. There is a retention cyst in the inferior left maxillary antrum. There is mucosal thickening in several ethmoid air cells bilaterally. No intraorbital lesions are evident. IMPRESSION: Atrophy with extensive periventricular small vessel disease. Prior infarct mid right frontal lobe. No acute infarct evident. No hemorrhage or mass effect. There is paranasal sinus disease at several sites. Question mucocele right maxillary sinus region, given mild medial expansion of the right maxillary antrum. Electronically Signed   By: Bretta BangWilliam  Woodruff III M.D.   On: 08/27/2015 10:44    Other results:  EKG: Sinus rhythm. Likely left atrial enlargement. 71 BPM. PR 0.123 s. QRS 0.87 s. QT/QTc 433/471 ms.  Assessment & Plan by Problem: Active Problems:   CVA (cerebral vascular accident) [facility](HCC)  Glenda Coleman is a 70 year old woman with Alzheimer's disease and hypertension presenting for cerebral vascular accident.  Stroke: New onset right-sided hemineglect, history of hypertension, and head CT showing history of prior infarct make stroke most likely. Normal blood glucose makes hypoglycemic episode unlikely. Head CT demonstrated no active infarct. Neurology consult suggested left  parietal occipital infarct, noting no blink to threat on the right side and left gaze preference that does not cross midline. Intracerebral hemorrhage and subarachnoid hemorrhage unlikely given head CT negative for hemorrhage. Her history of hypertension is suggestive of ischemic stroke, as well. Her medications prior to admission are unlikely to have contributed to development of stroke. Rankin score 4. Unable to assess NIH Stroke Score as she does not respond to commands. She has a normal WBC count and electrolyte levels, arguing against infection or electrolyte imbalance causing her symptoms. - MR brain - MRA head and neck - Echocardiogram - Carotid dopplers - ASA 325 mg, daily - Enoxaparin 40 mg, daily - PT/OT/Speech consult - Telemetry - NPO until swallow screen passed - Appreciate recommendations from neurology  Alzheimer's disease: Current baseline is ambulatory and nonverbal. Does not follow commands. CT showed atrophy and extensive periventricular small vessel disease. - Hold donepezil, memantine, sertraline while NPO  Hypertension: BP 166/95 on admission. May have contributed to stroke. Permissible hypertension in setting of ischemic stroke. - Hold hydrochlorothiazide, lisinopril, and simvastatin in setting of permissible hypertension for ischemic stroke and NPO.   This is a Psychologist, occupationalMedical Student Note.  The care of the patient was discussed with Dr. Sheliah Hatchhristopher Rice and the assessment and plan was formulated with their assistance.  Please see their note for official documentation of the patient encounter.   Signed: Lissa MoralesMichaela B Con Arganbright, Med Student 08/27/2015, 2:14 PM

## 2015-08-28 ENCOUNTER — Observation Stay (HOSPITAL_COMMUNITY): Payer: Medicare Other

## 2015-08-28 DIAGNOSIS — E785 Hyperlipidemia, unspecified: Secondary | ICD-10-CM | POA: Diagnosis present

## 2015-08-28 DIAGNOSIS — E119 Type 2 diabetes mellitus without complications: Secondary | ICD-10-CM | POA: Diagnosis present

## 2015-08-28 DIAGNOSIS — Z515 Encounter for palliative care: Secondary | ICD-10-CM | POA: Diagnosis not present

## 2015-08-28 DIAGNOSIS — R1314 Dysphagia, pharyngoesophageal phase: Secondary | ICD-10-CM | POA: Diagnosis present

## 2015-08-28 DIAGNOSIS — Z66 Do not resuscitate: Secondary | ICD-10-CM | POA: Diagnosis not present

## 2015-08-28 DIAGNOSIS — I10 Essential (primary) hypertension: Secondary | ICD-10-CM | POA: Diagnosis present

## 2015-08-28 DIAGNOSIS — G309 Alzheimer's disease, unspecified: Secondary | ICD-10-CM | POA: Diagnosis present

## 2015-08-28 DIAGNOSIS — G8191 Hemiplegia, unspecified affecting right dominant side: Secondary | ICD-10-CM | POA: Diagnosis present

## 2015-08-28 DIAGNOSIS — I619 Nontraumatic intracerebral hemorrhage, unspecified: Secondary | ICD-10-CM | POA: Diagnosis present

## 2015-08-28 DIAGNOSIS — I639 Cerebral infarction, unspecified: Secondary | ICD-10-CM | POA: Diagnosis present

## 2015-08-28 DIAGNOSIS — F0390 Unspecified dementia without behavioral disturbance: Secondary | ICD-10-CM | POA: Diagnosis not present

## 2015-08-28 DIAGNOSIS — R414 Neurologic neglect syndrome: Secondary | ICD-10-CM | POA: Diagnosis present

## 2015-08-28 DIAGNOSIS — Z791 Long term (current) use of non-steroidal anti-inflammatories (NSAID): Secondary | ICD-10-CM | POA: Diagnosis not present

## 2015-08-28 DIAGNOSIS — I63412 Cerebral infarction due to embolism of left middle cerebral artery: Secondary | ICD-10-CM | POA: Diagnosis present

## 2015-08-28 DIAGNOSIS — Z7189 Other specified counseling: Secondary | ICD-10-CM | POA: Diagnosis not present

## 2015-08-28 DIAGNOSIS — F0280 Dementia in other diseases classified elsewhere without behavioral disturbance: Secondary | ICD-10-CM | POA: Diagnosis not present

## 2015-08-28 DIAGNOSIS — F0391 Unspecified dementia with behavioral disturbance: Secondary | ICD-10-CM | POA: Diagnosis not present

## 2015-08-28 LAB — ECHOCARDIOGRAM COMPLETE
CHL CUP MV DEC (S): 218
E decel time: 218 msec
EERAT: 9.87
FS: 25 % — AB (ref 28–44)
IVS/LV PW RATIO, ED: 0.84
LA vol A4C: 28.6 ml
LASIZE: 31 mm
LAVOL: 38.6 mL
LDCA: 2.84 cm2
LEFT ATRIUM END SYS DIAM: 31 mm
LV PW d: 13 mm — AB (ref 0.6–1.1)
LVEEAVG: 9.87
LVEEMED: 9.87
LVELAT: 6.09 cm/s
LVOT diameter: 19 mm
MV pk E vel: 60.1 m/s
MVPKAVEL: 94.6 m/s
TDI e' lateral: 6.09
TDI e' medial: 5.11
Weight: 2640 oz

## 2015-08-28 LAB — LIPID PANEL
CHOL/HDL RATIO: 1.8 ratio
CHOLESTEROL: 186 mg/dL (ref 0–200)
HDL: 106 mg/dL (ref >40)
LDL Cholesterol: 67 mg/dL (ref 0–99)
TRIGLYCERIDES: 63 mg/dL (ref <150)
VLDL: 13 mg/dL (ref 0–40)

## 2015-08-28 LAB — HEMOGLOBIN A1C
HEMOGLOBIN A1C: 6.1 % — AB (ref 4.8–5.6)
Mean Plasma Glucose: 128 mg/dL

## 2015-08-28 MED ORDER — SODIUM CHLORIDE 0.9 % IV SOLN
INTRAVENOUS | Status: DC
  Administered 2015-08-28: 11:00:00 via INTRAVENOUS
  Administered 2015-08-28: 1000 mL via INTRAVENOUS
  Administered 2015-08-29: 04:00:00 via INTRAVENOUS

## 2015-08-28 MED ORDER — LORAZEPAM 2 MG/ML IJ SOLN
1.0000 mg | Freq: Four times a day (QID) | INTRAMUSCULAR | Status: DC | PRN
  Administered 2015-08-28 – 2015-08-29 (×3): 1 mg via INTRAVENOUS
  Filled 2015-08-28 (×2): qty 1

## 2015-08-28 MED ORDER — LORAZEPAM 2 MG/ML IJ SOLN
INTRAMUSCULAR | Status: AC
  Filled 2015-08-28: qty 1

## 2015-08-28 MED ORDER — ASPIRIN 300 MG RE SUPP
300.0000 mg | Freq: Every day | RECTAL | Status: DC
  Administered 2015-08-28 – 2015-08-29 (×2): 300 mg via RECTAL
  Filled 2015-08-28 (×2): qty 1

## 2015-08-28 MED ORDER — GADOBENATE DIMEGLUMINE 529 MG/ML IV SOLN
16.0000 mL | Freq: Once | INTRAVENOUS | Status: AC | PRN
  Administered 2015-08-28: 16 mL via INTRAVENOUS

## 2015-08-28 NOTE — Progress Notes (Signed)
OT Cancellation Note  Patient Details Name: Glenda Coleman MRN: [id number] DOB: 1945/04/03   Cancelled Treatment:    Reason Eval/Treat Not Completed: Patient not medically ready. 2nd attempt at OT eval today. Pt agitated, combative and in restraints; not following commands at this time. Will follow up once pt more appropriate.  Gaye AlkenBailey A Garrette Caine M.S., OTR/L Pager: [phone]  08/28/2015, 10:39 AM

## 2015-08-28 NOTE — Progress Notes (Addendum)
STROKE TEAM PROGRESS NOTE   HISTORY OF PRESENT ILLNESS (per record) Glenda Coleman is an 70 y.o. female. History obtained from EMS: "she was at her baseline mental status around 5 PM yesterday afternoon. Upon waking up this morning staff members found that she was completely flaccid on her right side. EMS was called and upon their arrival had 3-4 episodes of nonbilious nonbloody emesis. Slowly moving her right side during EMS transport. History of Alzheimer's dementia. According to nursing facility she is nonverbal and ambulatory at baseline." Currently she is moving her right side but still has left gaze preference.   Date last known well: yesterday Time last known well: Unable to determine tPA Given: No: out of time window Modified Rankin: Rankin Score=4   SUBJECTIVE (INTERVAL HISTORY) Her brother and son are at the bedside.  Overall she feels her condition is stable. As per brother, pt recently move from ALF to SNF due to decline from dementia. She has left MCA moderate to large infarct, but still able to respond to voice and verbal although not in complete sentence and most time did not make sense. Do not following commands. Moving all extremities. Family agree with palliative consult for goal of care.   OBJECTIVE Temp:  [97.3 F (36.3 C)-98.7 F (37.1 C)] 98.6 F (37 C) (06/17 0543) Pulse Rate:  [60-79] 72 (06/17 0543) Cardiac Rhythm:  [-] Normal sinus rhythm (06/17 0700) Resp:  [14-22] 18 (06/17 0543) BP: (108-169)/(70-95) 155/73 mmHg (06/17 0543) SpO2:  [95 %-100 %] 98 % (06/17 0543) Weight:  [74.844 kg (165 lb)] 74.844 kg (165 lb) (06/16 1620)  CBC:   Recent Labs Lab 08/27/15 1010 08/27/15 1021  WBC 5.5  --   NEUTROABS 3.5  --   HGB 11.7* 12.9  HCT 37.3 38.0  MCV 88.0  --   PLT 272  --     Basic Metabolic Panel:   Recent Labs Lab 08/27/15 1010 08/27/15 1021  NA 139 141  K 4.4 4.3  CL 106 105  CO2 24  --    GLUCOSE 131* 132*  BUN 15 21*  CREATININE 1.08* 1.10*  CALCIUM 9.5  --     Lipid Panel:     Component Value Date/Time   CHOL 186 08/28/2015 0535   TRIG 63 08/28/2015 0535   HDL 106 08/28/2015 0535   CHOLHDL 1.8 08/28/2015 0535   VLDL 13 08/28/2015 0535   LDLCALC 67 08/28/2015 0535   HgbA1c:  Lab Results  Component Value Date   HGBA1C 6.1* 08/27/2015   Urine Drug Screen: No results found for: LABOPIA, COCAINSCRNUR, LABBENZ, AMPHETMU, THCU, LABBARB    IMAGING I have personally reviewed the radiological images below and agree with the radiology interpretations.  Ct Head Wo Contrast 08/27/2015   Atrophy with extensive periventricular small vessel disease. Prior infarct mid right frontal lobe. No acute infarct evident. No hemorrhage or mass effect. There is paranasal sinus disease at several sites. Question mucocele right maxillary sinus region, given mild medial expansion of the right maxillary antrum.   MRI MRA Head and neck Wo Contrast 08/28/2015 Acute LEFT MCA territory infarction, posterior M2 division, with reperfusion hemorrhage.  Embolic stroke is suspected.  Compared with yesterday's CT, no hemorrhage was apparent and the infarct itself was not visible. Atrophy and small vessel disease. No extracranial or proximal intracranial vascular occlusion or flow reducing stenosis.  TTE - pending  LE venous doppler - pending   PHYSICAL EXAM  Temp:  [98 F (36.7 C)-98.7 F (37.1  C)] 98.3 F (36.8 C) (06/17 1311) Pulse Rate:  [58-79] 58 (06/17 1311) Resp:  [16-22] 16 (06/17 1311) BP: (108-189)/(70-89) 189/88 mmHg (06/17 1311) SpO2:  [95 %-100 %] 97 % (06/17 1311) Weight:  [165 lb (74.844 kg)] 165 lb (74.844 kg) (06/16 1620)  General - Well nourished, well developed, in no apparent distress, eyes closed, lethargic.  Ophthalmologic - SFundi not visualized due to eyes closed.  Cardiovascular - Regular rate and rhythm.  Neuro - lethargic, eyes closed, not following  commands, able to have words out, fluent, but not in sentences and makes no sense. PERRL, against eye opening, eyes in middle, facial symmetrical, mildly dysarthric, tongue midline inside mouth. LUE and LLE strongly against gravity and spontaneous movement, on pain stimulation, RUE 3+/5 and RLE 3-/5. DTR 1+, no increased muscle tone, babinski negative. Sensation, gait and coordination not tested.   ASSESSMENT/PLAN [name] is a 70 y.o. female with history of Alzheimer's dementia (nonverbal at baseline), hypertension, diabetes mellitus, previous stroke, and previous TIA, presenting with right hemiplegia. She did not receive IV t-PA due to late presentation.  Stroke:  Dominant acute left MCA territory infarct believed to be embolic from an unknown source.  Resultant  Lethargy, left hemiparesis  MRI - Acute LEFT MCA territory infarction, posterior M2 division, with hemorrhage transformation.   MRA head and neck - unremarkable but motion degraded  2D Echo pending   LE venous doppler pending  Consider 30 day cardiac event monitoring if desired by family  LDL - 67  HgbA1c 6.1  VTE prophylaxis - SCDs Diet NPO time specified  No antithrombotic prior to admission, now on aspirin 300 mg suppository daily.   Ongoing aggressive stroke risk factor management  Therapy recommendations: Pending  Disposition: Pending  Hypertension  Stable  Permissive hypertension (OK if < 220/120) but gradually normalize in 5-7 days  Long-term BP goal normotensive  Hyperlipidemia  Home meds: Zocor 40 mg daily not resumed in hospital.  LDL 67, goal < 70  Resume zocor once po access  Continue statin at discharge  Diabetes  HgbA1c 6.1, goal < 7.0  Controlled  Advance dementia  Recently moved from ALF to SNF due to condition decline  Not able to follow commands at baseline  Verbal output at baseline but not make sense  On aricept and namenda as home meds - resume once po  access.  Other Stroke Risk Factors  Advanced age  Hx stroke/TIA  Other Active Problems  Palliative consult for goal of care  Hospital day # 1  Marvel Plan, MD PhD Stroke Neurology 08/28/2015 4:17 PM   To contact Stroke Continuity provider, please refer to [email or web address]. After hours, contact General Neurology

## 2015-08-28 NOTE — Clinical Social Work Note (Signed)
Clinical Social Work Assessment  Patient Details  Name: Glenda GlassingMary Coleman MRN: [id number] Date of Birth: December 18, 1945  Date of referral:  08/28/15               Reason for consult:  Facility Placement                Permission sought to share information with:  Facility Medical sales representativeContact Representative, Family Supports Permission granted to share information::  No (Patient is disoriented; completed assessment w/ son)  Name::     Glenda AlstromMaurice  Agency::  [facility]  Relationship::  Son  SolicitorContact Information:  [phone]  Housing/Transportation Living arrangements for the past 2 months:  Assisted Living Facility Source of Information:  Adult Children Patient Interpreter Needed:  None Criminal Activity/Legal Involvement Pertinent to Current Situation/Hospitalization:  No - Comment as needed Significant Relationships:  Adult Children, Siblings Lives with:  Self Do you feel safe going back to the place where you live?  Yes Need for family participation in patient care:  Yes (Comment)  Care giving concerns:  CSW received referral for discharge planning. Patient is disoriented. CSW spoke with patient's son. [facility] unit.   Social Worker assessment / plan:   Per son, [facility]   Employment status:  Retired Engineer, miningnsurance information:  Medicare, Managed Care PT Recommendations:  Not assessed at this time Information / Referral to community resources:     Patient/Family's Response to care:  Patient's son reports agreement with discharge plan.  Patient/Family's Understanding of and Emotional Response to Diagnosis, Current Treatment, and Prognosis:  No questions/concerns at this time.  Emotional Assessment Appearance:  Appears stated age Attitude/Demeanor/Rapport:  Unable to Assess Affect (typically observed):  Unable to Assess Orientation:   (Disoriented x4) Alcohol / Substance use:  Not Applicable Psych  involvement (Current and /or in the community):  No (Comment)  Discharge Needs  Concerns to be addressed:  Care Coordination Readmission within the last 30 days:  No Current discharge risk:  None Barriers to Discharge:  Continued Medical Work up   In[employer] Incadia S Secundino Ellithorpe, LCSWA 08/28/2015, 4:36 PM

## 2015-08-28 NOTE — Progress Notes (Signed)
PT Cancellation Note  Patient Details Name: Glenda Coleman MRN: [id number] DOB: 04-Jan-1946   Cancelled Treatment:    Reason Eval/Treat Not Completed: Other (comment) Pt recently placed in restraints. Attempted to perform PT evaluation by taking off 1 restraint and mitt but pt becoming agitated, not able to redirect and not following commands. Not safe to perform assessment at this time. Will follow up.   Blake DivineShauna A Shay Bartoli 08/28/2015, 10:39 AM ,Mylo RedShauna Arnika Larzelere, PT, DPT [phone]

## 2015-08-28 NOTE — Progress Notes (Signed)
Subjective: Glenda Coleman is a 70 y/o woman with Alzheimer's disease and hypertension presenting for CVA. There were no major events overnight. She required ativan for agitation. This morning she was not responsive to questions or following commands, which is the same as her status yesterday.  Objective: Vital signs in last 24 hours: Filed Vitals:   08/28/15 0005 08/28/15 0202 08/28/15 0406 08/28/15 0543  BP: 159/84 168/77 161/70 155/73  Pulse:    72  Temp: 98.1 F (36.7 C) 98.2 F (36.8 C) 98.1 F (36.7 C) 98.6 F (37 C)  TempSrc: Axillary Axillary Axillary Axillary  Resp: 18 22 20 18   Weight:      SpO2: 99% 100% 98% 98%   General: Laying in bed. In no apparent distress. CV: RRR. Normal S1/S2. No m/r/g. Pulm: CTAB. Abdomen: Soft, NT/ND. Positive bowel sounds. Ext: No peripheral edema. 2+ pulses. Neuro: Right hemineglect. Leftward gaze. Sensory and motor function intact.  Lab Results: Basic Metabolic Panel:  Recent Labs  16/12/9604/16/17 1010 08/27/15 1021  NA 139 141  K 4.4 4.3  CL 106 105  CO2 24  --   GLUCOSE 131* 132*  BUN 15 21*  CREATININE 1.08* 1.10*  CALCIUM 9.5  --    Liver Function Tests:  Recent Labs  08/27/15 1010  AST 38  ALT 14  ALKPHOS 43  BILITOT 1.1  PROT 6.9  ALBUMIN 3.8   CBC:  Recent Labs  08/27/15 1010 08/27/15 1021  WBC 5.5  --   NEUTROABS 3.5  --   HGB 11.7* 12.9  HCT 37.3 38.0  MCV 88.0  --   PLT 272  --    CBG:  Recent Labs  08/27/15 1004  GLUCAP 131*   Hemoglobin A1C:  Recent Labs  08/27/15 1010  HGBA1C 6.1*   Fasting Lipid Panel:  Recent Labs  08/28/15 0535  CHOL 186  HDL 106  LDLCALC 67  TRIG 63  CHOLHDL 1.8   Coagulation:  Recent Labs  08/27/15 1010  LABPROT 14.1  INR 1.07   Urinalysis:  Recent Labs  08/27/15 1109  COLORURINE YELLOW  LABSPEC 1.019  PHURINE 7.0  GLUCOSEU NEGATIVE  HGBUR NEGATIVE  BILIRUBINUR NEGATIVE  KETONESUR NEGATIVE  PROTEINUR 30*  NITRITE NEGATIVE  LEUKOCYTESUR  NEGATIVE    Studies/Results: Ct Head Wo Contrast  08/27/2015  CLINICAL DATA:  Right-sided weakness.  Alzheimer's dementia. EXAM: CT HEAD WITHOUT CONTRAST TECHNIQUE: Contiguous axial images were obtained from the base of the skull through the vertex without intravenous contrast. COMPARISON:  October 06, 2014 FINDINGS: Moderate diffuse atrophy is stable. There is no intracranial mass, hemorrhage, extra-axial fluid collection, or midline shift. There is widespread small vessel disease in the centra semiovale bilaterally. There is a stable prior infarct mid right frontal lobe. No acute infarct is evident. The bony calvarium appears intact. The mastoid air cells are clear. There is opacification of most of the right maxillary antrum with mild expansion of the right maxillary antrum medially. There is a retention cyst in the inferior left maxillary antrum. There is mucosal thickening in several ethmoid air cells bilaterally. No intraorbital lesions are evident. IMPRESSION: Atrophy with extensive periventricular small vessel disease. Prior infarct mid right frontal lobe. No acute infarct evident. No hemorrhage or mass effect. There is paranasal sinus disease at several sites. Question mucocele right maxillary sinus region, given mild medial expansion of the right maxillary antrum. Electronically Signed   By: Bretta BangWilliam  Woodruff III M.D.   On: 08/27/2015 10:44   Medications: I  have reviewed the patient's current medications. Scheduled Meds: . enoxaparin (LOVENOX) injection  40 mg Subcutaneous Q24H  . OLANZapine  2.5 mg Intramuscular Once   Continuous Infusions: . sodium chloride     PRN Meds:. Assessment/Plan: Active Problems:   CVA (cerebral vascular accident) [facility])  Glenda Coleman is a 71 y/o woman with Alzheimer's disease and hypertension presenting for CVA.  Stroke: Right-sided hemineglect, history of hypertension and prior infarct, and head CT negative for hemorrhage suggest ischemic stroke. Head CT  demonstrated a prior infarct in the mid-right frontal lobe and no active infarct. Rankin score 4. NIH Stroke Score could not be assessed. - MRI head - MRA head and neck - TTE - Carotid U/S - ppx ASA 325 mg, daily - PT/OT/Speech consult - NPO until swallow screen passed. Consider IVF if next swallow screen failed.  Alzheimer's disease: Baseline is ambulatory and nonverbal. CT showed atrophy and extensive periventricular small disease. - Hold donepezil, memantine, sertraline while NPO.  Hypertension: BP 155/73. Allowing permissive hypertension. - Hold HCTZ and lisinopril in setting of permissible hypertension for ischemic stroke and NPO.  Dyslipidemia: Holding simvastatin while NPO.  VTE ppx: Enoxaparin, subcutaneous  Code: Full code  This is a Psychologist, occupational Note.  The care of the patient was discussed with Dr. Sheliah Hatch and the assessment and plan formulated with their assistance.  Please see their attached note for official documentation of the daily encounter.     Lissa Morales, Med Student 08/28/2015, 8:56 AM

## 2015-08-28 NOTE — Progress Notes (Signed)
  Date: 08/28/2015  Patient name: Glenda Coleman  Medical record number: [id number]  Date of birth: Feb 29, 1946   I have seen and evaluated Glenda Coleman and discussed their care with the Residency Team. In brief, patient is a 70 y/o female with PMH of HTN and advanced Alzheimer's dementia who presents from nursing facility with right sided weakness. Patient is mostly non verbal at baseline and does not follow commands. History obtained form chart as patient is unable to give history. Did not respond to questions asked.   PMHx, Fam Hx, and/or Soc Hx : as per resident admit note  Filed Vitals:   08/28/15 0406 08/28/15 0543  BP: 161/70 155/73  Pulse:  72  Temp: 98.1 F (36.7 C) 98.6 F (37 C)  Resp: 20 18   Gen: awake, unable to answer questions, moving all 4 extremities CVS: RRR, normal heart sounds Lungs: CTA b/l Abd: soft, non tender, BS + Ext: no edema Neuro: moving all 4 extremities, does not follow commands, unable to assess strength or fine touch, R hemineglect to visual fields  Assessment and Plan: I have seen and evaluated the patient as outlined above. I agree with the formulated Assessment and Plan as detailed in the residents' note, with the following changes:   1. Acute CVA: - Patient with advanced dementia now with right sided weakness and hemineglect secondary to large right parietal CVA  - Given patient's overall poor baseline function and inability to follow commands it is unclear if she is a PT candidate. - PT, OT f/u noted - unable to work with patient sceondary to inability to follow commands - She awaits an official swallow evaluation - Will discuss with family as to goals of care - f/u carotid u/s and 2 D ECHO - Would start asa pr if unable to take PO  Earl LagosNischal Dontre Laduca, MD 6/17/201712:14 PM

## 2015-08-28 NOTE — Evaluation (Signed)
SLP Cancellation Note  Patient Details Name: Glenda Coleman MRN: [id number] DOB: 1945/10/19   Cancelled treatment:       Reason Eval/Treat Not Completed: Patient at procedure or test/unavailable (pt at MRI, RN reports pt lethargic - ? due to Ativan received earlier )   Mills KollerKimball, Kaleo Condrey Ann Khaleef Ruby, MS [facility] S[facility] [phone]

## 2015-08-28 NOTE — Progress Notes (Signed)
PT Cancellation Note  Patient Details Name: Devoria GlassingMary Wojtaszek MRN: [id number] DOB: Aug 01, 1945   Cancelled Treatment:    Reason Eval/Treat Not Completed: Patient's level of consciousness;Patient at procedure or test/unavailable Pt currently sedated for MRI. Awaiting to go down for test. Per RN, requesting to hold PT evaluation at this time due to above as pt combative and agitated earlier and needs pt to be get through MRI. Will follow up as time allows.   Blake DivineShauna A Arn Mcomber 08/28/2015, 8:45 AM Mylo RedShauna Demba Nigh, PT, DPT [phone]

## 2015-08-28 NOTE — Progress Notes (Signed)
Subjective: [name] has agitation overnight and this morning which required sedation for her own safety to receive medical treatments as her mental status is unable to follow commands and be redirected appropriately. This afternoon she was somewhat more interactive and verbal compared to this morning and last night although she still continues to not follow simple commands.  Objective: Vital signs in last 24 hours: Filed Vitals:   08/28/15 0202 08/28/15 0406 08/28/15 0543 08/28/15 1311  BP: 168/77 161/70 155/73 189/88  Pulse:   72 58  Temp: 98.2 F (36.8 C) 98.1 F (36.7 C) 98.6 F (37 C) 98.3 F (36.8 C)  TempSrc: Axillary Axillary Axillary Axillary  Resp: 22 20 18 16   Weight:      SpO2: 100% 98% 98% 97%   Weight change:  No intake or output data in the 24 hours ending 08/28/15 1319   GENERAL- Disoriented, awake spontaneously, not does not answer questions or follow direct commands HEENT- Inattentive to right visual field CARDIAC- RRR, no murmurs, rubs or gallops. RESP- Anterior breath sounds clear NEURO- Exam limited by patient noncooperative, strength apparently 5/5 b/l, 2+ reflexes b/l, responding inappropriately to right sided stimuli and will not passively maintain position of right limbs but uses them reflexively EXTREMITIES- pulse 2+, symmetric, no pedal edema SKIN- Warm, dry, No rash or lesion PSYCH- Easily startled, verbalizes simple acknowledgements but unable to answer questions  Lab Results: Basic Metabolic Panel:  Recent Labs Lab 08/27/15 1010 08/27/15 1021  NA 139 141  K 4.4 4.3  CL 106 105  CO2 24  --   GLUCOSE 131* 132*  BUN 15 21*  CREATININE 1.08* 1.10*  CALCIUM 9.5  --    Liver Function Tests:  Recent Labs Lab 08/27/15 1010  AST 38  ALT 14  ALKPHOS 43  BILITOT 1.1  PROT 6.9  ALBUMIN 3.8   CBC:  Recent Labs Lab 08/27/15 1010 08/27/15 1021  WBC 5.5  --   NEUTROABS 3.5  --   HGB 11.7* 12.9  HCT 37.3 38.0  MCV 88.0  --     PLT 272  --    CBG:  Recent Labs Lab 08/27/15 1004  GLUCAP 131*   Hemoglobin A1C:  Recent Labs Lab 08/27/15 1010  HGBA1C 6.1*   Fasting Lipid Panel:  Recent Labs Lab 08/28/15 0535  CHOL 186  HDL 106  LDLCALC 67  TRIG 63  CHOLHDL 1.8   Coagulation:  Recent Labs Lab 08/27/15 1010  LABPROT 14.1  INR 1.07   Urinalysis:  Recent Labs Lab 08/27/15 1109  COLORURINE YELLOW  LABSPEC 1.019  PHURINE 7.0  GLUCOSEU NEGATIVE  HGBUR NEGATIVE  BILIRUBINUR NEGATIVE  KETONESUR NEGATIVE  PROTEINUR 30*  NITRITE NEGATIVE  LEUKOCYTESUR NEGATIVE   Micro Results: No results found for this or any previous visit (from the past 240 hour(s)). Studies/Results: Ct Head Wo Contrast  08/27/2015  CLINICAL DATA:  Right-sided weakness.  Alzheimer's dementia. EXAM: CT HEAD WITHOUT CONTRAST TECHNIQUE: Contiguous axial images were obtained from the base of the skull through the vertex without intravenous contrast. COMPARISON:  October 06, 2014 FINDINGS: Moderate diffuse atrophy is stable. There is no intracranial mass, hemorrhage, extra-axial fluid collection, or midline shift. There is widespread small vessel disease in the centra semiovale bilaterally. There is a stable prior infarct mid right frontal lobe. No acute infarct is evident. The bony calvarium appears intact. The mastoid air cells are clear. There is opacification of most of the right maxillary antrum with mild expansion of  the right maxillary antrum medially. There is a retention cyst in the inferior left maxillary antrum. There is mucosal thickening in several ethmoid air cells bilaterally. No intraorbital lesions are evident. IMPRESSION: Atrophy with extensive periventricular small vessel disease. Prior infarct mid right frontal lobe. No acute infarct evident. No hemorrhage or mass effect. There is paranasal sinus disease at several sites. Question mucocele right maxillary sinus region, given mild medial expansion of the right  maxillary antrum. Electronically Signed   By: Bretta Bang III M.D.   On: 08/27/2015 10:44   Mr Maxine Glenn Head Wo Contrast  08/28/2015  CLINICAL DATA:  RIGHT-sided weakness. History of dementia. History of hypertension and previous stroke. EXAM: MRI HEAD WITHOUT AND WITH CONTRAST AND MRA HEAD WITHOUT AND WITH CONTRAST AND MRI NECK WITHOUT AND WITH CONTRAST TECHNIQUE: Multiplanar, multiecho pulse sequences of the brain and surrounding structures were obtained without and with intravenous contrast. Angiographic images of the head were obtained using MRA technique without and with contrast. Multiplanar, multiecho pulse sequences of the neck and surrounding structures were obtained without and with intravenous contrast. CONTRAST:  16mL MULTIHANCE GADOBENATE DIMEGLUMINE 529 MG/ML IV SOLN COMPARISON:  CT head 08/27/2015. FINDINGS: The patient was unable to remain motionless for the exam. Small or subtle lesions could be overlooked. MRI HEAD FINDINGS Confluent area of restricted diffusion in the LEFT temporal lobe, LEFT occipital lobe, and insula, slight involvement of LEFT frontal operculum, representing acute LEFT MCA territory infarction. Gyriform type susceptibility pattern on gradient sequence is consistent with acute hemorrhage, not present on yesterday's CT. No midline shift. Global atrophy with hydrocephalus ex vacuo. Scattered small lacunes and small areas of cerebellar infarction. Extensive focal and confluent T2 and FLAIR hyperintensities throughout the white matter, likely chronic microvascular ischemic change. Pituitary and cerebellar tonsils unremarkable. Cervical spondylosis. Chronic sinusitis, most notable in the RIGHT maxillary region. Post infusion, there is may be slight parenchymal enhancement in the area of acute infarction. Slight intravascular enhancement is also observed in the affected vascular territory. Major dural venous sinuses are patent. MRA HEAD FINDINGS Motion degraded exam. Subtle areas  of stenosis or thrombus could be overlooked. Grossly patent internal carotid arteries and basilar artery. Vertebrals patent, RIGHT dominant. No proximal large vessel occlusion. Irregularity of the BILATERAL M3 segment MCA vessels, but no proximal MCA bifurcation or trifurcation occlusion on the LEFT. Suspected infundibulum LEFT PCOM. Cerebellar branches poorly visualized due to motion degradation. Signal dropout of the ambient segment posterior cerebral arteries due to motion and in-plane flow. MRI NECK FINDINGS Conventional branching of the great vessels from the arch. No proximal great vessel narrowing. Suspected 50% stenosis at the origin of the RIGHT ICA. No similar stenosis on the LEFT. No carotid dissection. BILATERAL vertebral artery patency is established, with ostial narrowing at the smaller LEFT vertebral origin. IMPRESSION: Acute LEFT MCA territory infarction, posterior M2 division, with reperfusion hemorrhage. Embolic stroke is suspected. Compared with yesterday's CT, no hemorrhage was apparent and the infarct itself was not visible. Atrophy and small vessel disease. No extracranial or proximal intracranial vascular occlusion or flow reducing stenosis. These results were called by telephone at the time of interpretation on 08/28/2015 at 11:00 am to Dr. Hyman Hopes, who verbally acknowledged these results. Electronically Signed   By: Elsie Stain M.D.   On: 08/28/2015 11:01   Mr Angiogram Neck W Wo Contrast  08/28/2015  CLINICAL DATA:  RIGHT-sided weakness. History of dementia. History of hypertension and previous stroke. EXAM: MRI HEAD WITHOUT AND WITH CONTRAST AND MRA  HEAD WITHOUT AND WITH CONTRAST AND MRI NECK WITHOUT AND WITH CONTRAST TECHNIQUE: Multiplanar, multiecho pulse sequences of the brain and surrounding structures were obtained without and with intravenous contrast. Angiographic images of the head were obtained using MRA technique without and with contrast. Multiplanar, multiecho pulse  sequences of the neck and surrounding structures were obtained without and with intravenous contrast. CONTRAST:  16mL MULTIHANCE GADOBENATE DIMEGLUMINE 529 MG/ML IV SOLN COMPARISON:  CT head 08/27/2015. FINDINGS: The patient was unable to remain motionless for the exam. Small or subtle lesions could be overlooked. MRI HEAD FINDINGS Confluent area of restricted diffusion in the LEFT temporal lobe, LEFT occipital lobe, and insula, slight involvement of LEFT frontal operculum, representing acute LEFT MCA territory infarction. Gyriform type susceptibility pattern on gradient sequence is consistent with acute hemorrhage, not present on yesterday's CT. No midline shift. Global atrophy with hydrocephalus ex vacuo. Scattered small lacunes and small areas of cerebellar infarction. Extensive focal and confluent T2 and FLAIR hyperintensities throughout the white matter, likely chronic microvascular ischemic change. Pituitary and cerebellar tonsils unremarkable. Cervical spondylosis. Chronic sinusitis, most notable in the RIGHT maxillary region. Post infusion, there is may be slight parenchymal enhancement in the area of acute infarction. Slight intravascular enhancement is also observed in the affected vascular territory. Major dural venous sinuses are patent. MRA HEAD FINDINGS Motion degraded exam. Subtle areas of stenosis or thrombus could be overlooked. Grossly patent internal carotid arteries and basilar artery. Vertebrals patent, RIGHT dominant. No proximal large vessel occlusion. Irregularity of the BILATERAL M3 segment MCA vessels, but no proximal MCA bifurcation or trifurcation occlusion on the LEFT. Suspected infundibulum LEFT PCOM. Cerebellar branches poorly visualized due to motion degradation. Signal dropout of the ambient segment posterior cerebral arteries due to motion and in-plane flow. MRI NECK FINDINGS Conventional branching of the great vessels from the arch. No proximal great vessel narrowing. Suspected 50%  stenosis at the origin of the RIGHT ICA. No similar stenosis on the LEFT. No carotid dissection. BILATERAL vertebral artery patency is established, with ostial narrowing at the smaller LEFT vertebral origin. IMPRESSION: Acute LEFT MCA territory infarction, posterior M2 division, with reperfusion hemorrhage. Embolic stroke is suspected. Compared with yesterday's CT, no hemorrhage was apparent and the infarct itself was not visible. Atrophy and small vessel disease. No extracranial or proximal intracranial vascular occlusion or flow reducing stenosis. These results were called by telephone at the time of interpretation on 08/28/2015 at 11:00 am to Dr. Hyman Hopes, who verbally acknowledged these results. Electronically Signed   By: Elsie Stain M.D.   On: 08/28/2015 11:01   Mr Glenda Coleman ZO Contrast  08/28/2015  CLINICAL DATA:  RIGHT-sided weakness. History of dementia. History of hypertension and previous stroke. EXAM: MRI HEAD WITHOUT AND WITH CONTRAST AND MRA HEAD WITHOUT AND WITH CONTRAST AND MRI NECK WITHOUT AND WITH CONTRAST TECHNIQUE: Multiplanar, multiecho pulse sequences of the brain and surrounding structures were obtained without and with intravenous contrast. Angiographic images of the head were obtained using MRA technique without and with contrast. Multiplanar, multiecho pulse sequences of the neck and surrounding structures were obtained without and with intravenous contrast. CONTRAST:  16mL MULTIHANCE GADOBENATE DIMEGLUMINE 529 MG/ML IV SOLN COMPARISON:  CT head 08/27/2015. FINDINGS: The patient was unable to remain motionless for the exam. Small or subtle lesions could be overlooked. MRI HEAD FINDINGS Confluent area of restricted diffusion in the LEFT temporal lobe, LEFT occipital lobe, and insula, slight involvement of LEFT frontal operculum, representing acute LEFT MCA territory infarction. Gyriform type  susceptibility pattern on gradient sequence is consistent with acute hemorrhage, not present on  yesterday's CT. No midline shift. Global atrophy with hydrocephalus ex vacuo. Scattered small lacunes and small areas of cerebellar infarction. Extensive focal and confluent T2 and FLAIR hyperintensities throughout the white matter, likely chronic microvascular ischemic change. Pituitary and cerebellar tonsils unremarkable. Cervical spondylosis. Chronic sinusitis, most notable in the RIGHT maxillary region. Post infusion, there is may be slight parenchymal enhancement in the area of acute infarction. Slight intravascular enhancement is also observed in the affected vascular territory. Major dural venous sinuses are patent. MRA HEAD FINDINGS Motion degraded exam. Subtle areas of stenosis or thrombus could be overlooked. Grossly patent internal carotid arteries and basilar artery. Vertebrals patent, RIGHT dominant. No proximal large vessel occlusion. Irregularity of the BILATERAL M3 segment MCA vessels, but no proximal MCA bifurcation or trifurcation occlusion on the LEFT. Suspected infundibulum LEFT PCOM. Cerebellar branches poorly visualized due to motion degradation. Signal dropout of the ambient segment posterior cerebral arteries due to motion and in-plane flow. MRI NECK FINDINGS Conventional branching of the great vessels from the arch. No proximal great vessel narrowing. Suspected 50% stenosis at the origin of the RIGHT ICA. No similar stenosis on the LEFT. No carotid dissection. BILATERAL vertebral artery patency is established, with ostial narrowing at the smaller LEFT vertebral origin. IMPRESSION: Acute LEFT MCA territory infarction, posterior M2 division, with reperfusion hemorrhage. Embolic stroke is suspected. Compared with yesterday's CT, no hemorrhage was apparent and the infarct itself was not visible. Atrophy and small vessel disease. No extracranial or proximal intracranial vascular occlusion or flow reducing stenosis. These results were called by telephone at the time of interpretation on 08/28/2015  at 11:00 am to Dr. Hyman Hopes, who verbally acknowledged these results. Electronically Signed   By: Elsie Stain M.D.   On: 08/28/2015 11:01   Medications: I have reviewed the patient's current medications. Scheduled Meds: . aspirin  300 mg Rectal Daily  . OLANZapine  2.5 mg Intramuscular Once   Continuous Infusions: . sodium chloride 125 mL/hr at 08/28/15 1121   PRN Meds:.LORazepam Assessment/Plan: Acute left MCA infarct: MRI demonstrated moderate sized infarct of left parietal lobe in posterior L MCA distribution. There was also some reperfusion hemorrhage noted. These radiographic findings are suggestive of possible embolic etiology for her CVA. She has not yet been able to cooperate with therapists due to sedation, agitation, or being away for imaging. We will try to arrange and have these evaluations completed as able. She will definitely need to remain for further workup and neurological monitoring. -Neurology service recommendations much appreciated -Holding any A/C for hemorrhage -Carotid US -TTE -PT consult, OT consult, SLP consult -Cardiac monitoring  Hypertension: Mild to moderate HTN on HCTZ 25mg  and lisinopril 5mg . SBP in 160s on presentation. Permissive HTN at this time in setting of acute CVA. -Hold home lisinopril, HCTZ -Tolerate BP <220/110 at this time, will advance goals for days 3-7  Alzheimers dementia: spoke with family today about her baseline status which is mostly nonverbal and requiring full assistance for ADLs at her nursing facility. She is confused, easily startled, not following commands, minimally verbal, and was briefly combative with staff this morning from disorientation and required temporary restraint for continued medical therapy.  Given her advanced underlying dementia she is suffering at least in part from delirium that is likely continue throughout her hospitalization. Her prognosis is poor overall and will also depend on her ability to swallow safely in  determining her remaining quality of  life. Ideally she would be stabilized and tolerating PO intake and could be discharged back to her nursing facility.  Family is very reasonable with their expectations and want to minimize her suffering or futile medical treatments in the future. We will have another discussion with the family after more clarification of her functional capacity with therapists today. -Hold meds while initially NPO -May require additional ativan PRN to tolerate additional CVA workup or safely administer medical therapies -Consult to palliative care service for family meeting in the next 1-2 days as prognosis and workup is completed  FULL CODE Diet: NPO pending SLP eval VTE ppx: SCDs  Dispo: Disposition is deferred at this time, awaiting improvement of current medical problems. Anticipated discharge in approximately 2-3 day(s).   The patient does have a current PCP Elinor Dodge, MD)  The patient does have transportation limitations that hinder transportation to clinic appointments.    Fuller Plan, MD 08/28/2015, 1:19 PM

## 2015-08-28 NOTE — Evaluation (Signed)
Clinical/Bedside Swallow Evaluation Patient Details  Name: Glenda Coleman MRN: [id number] Date of Birth: 1945-04-18  Today's Date: 08/28/2015 Time: SLP Start Time (ACUTE ONLY): 1315 SLP Stop Time (ACUTE ONLY): 1400 SLP Time Calculation (min) (ACUTE ONLY): 45 min  Past Medical History:  Past Medical History  Diagnosis Date  . Hypertension   . Type II diabetes mellitus (HCC)   . TIA (transient ischemic attack) dx'd 2015    "dr said she'd had a few; put her on aspirin at that point"  . Stroke (HCC) 08/27/2015  . Arthritis     "back" (08/27/2015)  . Chronic lower back pain   . Depression   . Alzheimer disease   . Dementia     "severe" (08/27/2015)   Past Surgical History:  Past Surgical History  Procedure Laterality Date  . Partial knee arthroplasty Left   . Abdominal hysterectomy     HPI:  [id number] [facility] MRI demonstrated moderate sized infarct of left parietal lobe in posterior L MCA distribution.  PMH + for dementia and she resides in SNF.  Swallow evaluation ordered = pt unable to be seen earlier due to lethargy.  Pt's son and brother present and they report being unaware of pt having h/o of dysphagia.  They report h/o UTIs - frequently -    Assessment / Plan / Recommendation Clinical Impression  Pt presents with sleepiness but would did verbally respons to SLP.  Pt orally holding secretions upon SLP entrance to room - but with max cues to swallow she did elicit swallow.  Using hand over hand assist *for neuro input* pt able to tolerate intake of graham cracker, applesauce, apple juice via cup/straw.  No oral residuals or indication of pharyngeal residuals or airway compromisewith intake.    Of note, pt did require total assist to feed herself due to weakness *son report he does not think she feeds herself prior to admit.  Encouraged family to have pt help when she can tolerate hand untied *tied for safety.    Recommend regular/thin diet only when pt fully alert.  Educated pt's family to recommendations and precautions using teach back.  SLP to follow up x1 to assure tolerance.     Aspiration Risk  Mild aspiration risk    Diet Recommendation Regular;Thin liquid   Liquid Administration via: Cup;Straw Medication Administration: Whole meds with puree Supervision: Full supervision/cueing for compensatory strategies Compensations: Slow rate;Small sips/bites;Minimize environmental distractions    Other  Recommendations Oral Care Recommendations: Oral care BID   Follow up Recommendations    TBD    Frequency and Duration min 1 x/week  1 week       Prognosis Prognosis for Safe Diet Advancement: Good Barriers to Reach Goals: Behavior      Swallow Study   General Date of Onset: 08/28/15 H[facility]: [id number] [facility], MRI demonstrated moderate sized infarct of left parietal lobe in posterior L MCA distribution.  PMH + for dementia and she resides in SNF.  Swallow evaluation ordered = pt unable to be seen earlier due to lethargy.  Pt's son and brother present and they report being unaware of pt having h/o of dysphagia.  They report h/o UTIs - frequently -  Type of Study: Bedside Swallow Evaluation Diet Prior to this Study: NPO Temperature Spikes Noted: No Respiratory Status: Nasal cannula History of Recent Intubation: No Behavior/Cognition: Requires cueing;Confused;Doesn't follow directions (alert intermittently) Oral Cavity Assessment: Excessive secretions (pt holding oral secretions requiring cue  to swallow ) Oral Care Completed by SLP: No Oral Cavity - Dentition: Dentures, bottom;Dentures, top Self-Feeding Abilities: Total assist Patient Positioning: Upright in bed Baseline Vocal Quality: Normal Volitional Cough: Cognitively unable to elicit Volitional Swallow: Able to elicit (with maximum cue)    Oral/Motor/Sensory Function Overall Oral Motor/Sensory Function: Generalized oral weakness (generalized weakness, no focal deficits overtly  apparent)   Ice Chips Ice chips: Impaired Presentation: Spoon Oral Phase Impairments: Reduced lingual movement/coordination Oral Phase Functional Implications: Prolonged oral transit   Thin Liquid Thin Liquid: Impaired Presentation: Spoon;Cup;Straw Oral Phase Impairments: Reduced lingual movement/coordination;Reduced labial seal Oral Phase Functional Implications: Left anterior spillage    Nectar Thick Nectar Thick Liquid: Not tested   Honey Thick Honey Thick Liquid: Not tested   Puree Puree: Within functional limits Presentation: Spoon   Solid   GO   Solid: Within functional limits Other Comments: graham cracker, self fed with hand over hand assistance    Functional Assessment Tool Used: clinical judgement Functional Limitations: Swallowing Swallow Current Status (Z6109): At least 1 percent but less than 20 percent impaired, limited or restricted Swallow Goal Status [phone]): At least 1 percent but less than 20 percent impaired, limited or restricted   Mills Koller, MS [facility] SLP [phone]

## 2015-08-28 NOTE — Progress Notes (Signed)
OT Cancellation Note  Patient Details Name: Glenda Coleman Labate MRN: [id number] DOB: 15-Apr-1945   Cancelled Treatment:    Reason Eval/Treat Not Completed: Patient at procedure or test/ unavailable. Pt currently at MRI. Per PT, RN reports pt was sedated for MRI and has been agitated and combative this AM. Will follow up for OT eval as pt is appropriate and time allows.  Gaye AlkenBailey A Keyonia Gluth M.S., OTR/L Pager: [phone]  08/28/2015, 8:44 AM

## 2015-08-28 NOTE — Progress Notes (Signed)
  Echocardiogram 2D Echocardiogram has been performed.  Delcie RochENNINGTON, Kerilyn Cortner 08/28/2015, 3:35 PM
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
## 2015-08-29 ENCOUNTER — Inpatient Hospital Stay (HOSPITAL_COMMUNITY): Payer: Medicare Other

## 2015-08-29 DIAGNOSIS — I639 Cerebral infarction, unspecified: Secondary | ICD-10-CM

## 2015-08-29 MED ORDER — ASPIRIN 81 MG PO CHEW: 81.0000 mg | CHEWABLE_TABLET | Freq: Every day | ORAL | Status: DC

## 2015-08-29 MED ORDER — MEMANTINE HCL 5 MG PO TABS
10.0000 mg | ORAL_TABLET | Freq: Every day | ORAL | Status: DC
  Administered 2015-08-29 – 2015-08-30 (×2): 10 mg via ORAL
  Filled 2015-08-29 (×2): qty 2

## 2015-08-29 MED ORDER — ASPIRIN 325 MG PO TABS: 325.0000 mg | ORAL_TABLET | Freq: Every day | ORAL | Status: DC

## 2015-08-29 MED ORDER — DONEPEZIL HCL 10 MG PO TABS
20.0000 mg | ORAL_TABLET | Freq: Every day | ORAL | Status: DC
  Administered 2015-08-29 – 2015-08-30 (×2): 20 mg via ORAL
  Filled 2015-08-29 (×2): qty 2

## 2015-08-29 MED ORDER — ASPIRIN EC 325 MG PO TBEC
325.0000 mg | DELAYED_RELEASE_TABLET | Freq: Every day | ORAL | Status: DC
  Administered 2015-08-29 – 2015-08-30 (×2): 325 mg via ORAL
  Filled 2015-08-29 (×2): qty 1

## 2015-08-29 MED ORDER — SERTRALINE HCL 100 MG PO TABS
100.0000 mg | ORAL_TABLET | Freq: Every day | ORAL | Status: DC
  Administered 2015-08-29 – 2015-09-02 (×5): 100 mg via ORAL
  Filled 2015-08-29 (×5): qty 1

## 2015-08-29 MED ORDER — SIMVASTATIN 40 MG PO TABS
40.0000 mg | ORAL_TABLET | Freq: Every day | ORAL | Status: DC
  Administered 2015-08-29 – 2015-09-01 (×3): 40 mg via ORAL
  Filled 2015-08-29 (×4): qty 1

## 2015-08-29 NOTE — Evaluation (Signed)
Physical Therapy Evaluation Patient Details Name: Glenda Coleman MRN: [id number] DOB: 01-May-1945 Today's Date: 08/29/2015   History of Present Illness  70 y/o woman with PMHx significant for hypertension and advanced alzheimers dementia presents to the ED with complete right sided weakness and hemineglect. MRI on 6/17 + for acute LEFT MCA territory infarction.   Clinical Impression  Patient demonstrates deficits in functional mobility as indicated below. Will need continued skilled PT to address deficits and maximize function. Will see as indicated and progress as tolerated. Recommend SNF upon acute discharge for stroke rehabilitation as patient was mobile and could perform some self care PTA.    Follow Up Recommendations SNF;Supervision/Assistance - 24 hour    Equipment Recommendations  Other (comment) (TBD at next venue)    Recommendations for Other Services       Precautions / Restrictions Precautions Precautions: Fall Restrictions Weight Bearing Restrictions: No      Mobility  Bed Mobility Overal bed mobility: Needs Assistance Bed Mobility: Supine to Sit;Sit to Supine     Supine to sit: Mod assist Sit to supine: Min assist   General bed mobility comments: Max cues for initiation and task completion. Patient easily distracted, multi modal cues and encouragement to engage  Transfers Overall transfer level: Needs assistance   Transfers: Sit to/from Stand;Stand Pivot Transfers Sit to Stand: Mod assist Stand pivot transfers: Mod assist;+2 physical assistance       General transfer comment: moderate assist to power up to standing, moderate assist for stability, 2 person moderate assist for transfer to [facility] and back to bed. increased time to perform with cues for positioning and task initiation  Ambulation/Gait             General Gait Details: not performed  Stairs            Wheelchair Mobility    Modified Rankin (Stroke Patients Only) Modified Rankin  (Stroke Patients Only) Pre-Morbid Rankin Score: No significant disability Modified Rankin: Severe disability     Balance Overall balance assessment: Needs assistance Sitting-balance support: Bilateral upper extremity supported;Feet supported Sitting balance-Leahy Scale: Fair Sitting balance - Comments: could sit EOB without physical assist     Standing balance-Leahy Scale: Poor Standing balance comment: required assist for stability due to LE deficits                             Pertinent Vitals/Pain Pain Assessment: Faces Faces Pain Scale: Hurts little more Pain Location: abdominal pain Pain Descriptors / Indicators: Grimacing Pain Intervention(s): Monitored during session    Home Living Family/patient expects to be discharged to:: Skilled nursing facility [facility])                 Additional Comments: Pts son reports pt was living in assisted living at [facility] up until about a week ago when she was moved to skilled side of facility.     Prior Function Level of Independence: Needs assistance   Gait / Transfers Assistance Needed: Ambulatory PTA.  ADL's / Homemaking Assistance Needed: Pt participating in ADLs PTA with assist. Could get herself dressed, assist with bathing 2x per week.        Hand Dominance        Extremity/Trunk Assessment   Upper Extremity Assessment: RUE deficits/detail;Difficult to assess due to impaired cognition           Lower Extremity Assessment: RLE deficits/detail;Difficult to assess due to impaired cognition  Cervical / Trunk Assessment: Kyphotic  Communication   Communication: Expressive difficulties  Cognition Arousal/Alertness: Awake/alert Behavior During Therapy: Restless;Flat affect Overall Cognitive Status: History of cognitive impairments - at baseline                      General Comments      Exercises        Assessment/Plan    PT Assessment Patient needs  continued PT services  PT Diagnosis Difficulty walking;Abnormality of gait;Generalized weakness;Altered mental status   PT Problem List Decreased strength;Decreased activity tolerance;Decreased balance;Decreased mobility;Decreased cognition;Decreased safety awareness  PT Treatment Interventions DME instruction;Gait training;Functional mobility training;Therapeutic activities;Therapeutic exercise;Balance training;Cognitive remediation;Patient/family education   PT Goals (Current goals can be found in the Care Plan section) Acute Rehab PT Goals Patient Stated Goal: per son, to get back to mobility level PT Goal Formulation: With patient/family Time For Goal Achievement: 09/12/15 Potential to Achieve Goals: Fair    Frequency Min 3X/week   Barriers to discharge        Co-evaluation               End of Session Equipment Utilized During Treatment: Gait belt Activity Tolerance: Patient tolerated treatment well Patient left: in bed;with call bell/phone within reach;with bed alarm set Nurse Communication: Mobility status;Other (comment) (patient + BM during session hygiene performed)         Time: [id number] PT Time Calculation (min) (ACUTE ONLY): 25 min   Charges:   PT Evaluation $PT Eval Moderate Complexity: 1 Procedure     PT G CodesFabio Asa:        Loriann Bosserman J 08/29/2015, 3:42 PM  Charlotte Crumbevon Eual Lindstrom, PT DPT  [phone]

## 2015-08-29 NOTE — Progress Notes (Signed)
VASCULAR LAB PRELIMINARY  PRELIMINARY  PRELIMINARY  PRELIMINARY  Bilateral lower extremity venous duplex completed.    Preliminary report:  There is no obvious evidence of DVT or SVT noted in the bilateral lower extremities.   Keiarra Charon, RVT 08/29/2015, 3:31 PM

## 2015-08-29 NOTE — Progress Notes (Signed)
Subjective: Pt alert this morning w/ b/l soft wrist restraints.   Objective: Vital signs in last 24 hours: Filed Vitals:   08/28/15 1311 08/28/15 1816 08/28/15 2135 08/29/15 0445  BP: 189/88 172/88 106/70 161/83  Pulse: 58 91 100 65  Temp: 98.3 F (36.8 C)  99.5 F (37.5 C) 97.6 F (36.4 C)  TempSrc: Axillary  Axillary Axillary  Resp: 16 20 20    Weight:      SpO2: 97% 98% 95% 98%   Weight change:   Intake/Output Summary (Last 24 hours) at 08/29/15 0912 Last data filed at 08/29/15 0600  Gross per 24 hour  Intake    935 ml  Output      0 ml  Net    935 ml     GENERAL- Disoriented, awake spontaneously, not does not answer questions or follow direct commands HEENT- Inattentive to right visual field CARDIAC- RRR, no murmurs, rubs or gallops. RESP- Anterior breath sounds clear NEURO- pt unable to follow commands.  EXTREMITIES- b/l SCDs SKIN- Warm, dry, No rash or lesion   Lab Results: Basic Metabolic Panel:  Recent Labs Lab 08/27/15 1010 08/27/15 1021  NA 139 141  K 4.4 4.3  CL 106 105  CO2 24  --   GLUCOSE 131* 132*  BUN 15 21*  CREATININE 1.08* 1.10*  CALCIUM 9.5  --    Liver Function Tests:  Recent Labs Lab 08/27/15 1010  AST 38  ALT 14  ALKPHOS 43  BILITOT 1.1  PROT 6.9  ALBUMIN 3.8   CBC:  Recent Labs Lab 08/27/15 1010 08/27/15 1021  WBC 5.5  --   NEUTROABS 3.5  --   HGB 11.7* 12.9  HCT 37.3 38.0  MCV 88.0  --   PLT 272  --    CBG:  Recent Labs Lab 08/27/15 1004  GLUCAP 131*   Hemoglobin A1C:  Recent Labs Lab 08/27/15 1010  HGBA1C 6.1*   Fasting Lipid Panel:  Recent Labs Lab 08/28/15 0535  CHOL 186  HDL 106  LDLCALC 67  TRIG 63  CHOLHDL 1.8   Coagulation:  Recent Labs Lab 08/27/15 1010  LABPROT 14.1  INR 1.07   Urinalysis:  Recent Labs Lab 08/27/15 1109  COLORURINE YELLOW  LABSPEC 1.019  PHURINE 7.0  GLUCOSEU NEGATIVE  HGBUR NEGATIVE  BILIRUBINUR NEGATIVE  KETONESUR NEGATIVE  PROTEINUR 30*    NITRITE NEGATIVE  LEUKOCYTESUR NEGATIVE   Studies/Results: Ct Head Wo Contrast  08/27/2015  CLINICAL DATA:  Right-sided weakness.  Alzheimer's dementia. EXAM: CT HEAD WITHOUT CONTRAST TECHNIQUE: Contiguous axial images were obtained from the base of the skull through the vertex without intravenous contrast. COMPARISON:  October 06, 2014 FINDINGS: Moderate diffuse atrophy is stable. There is no intracranial mass, hemorrhage, extra-axial fluid collection, or midline shift. There is widespread small vessel disease in the centra semiovale bilaterally. There is a stable prior infarct mid right frontal lobe. No acute infarct is evident. The bony calvarium appears intact. The mastoid air cells are clear. There is opacification of most of the right maxillary antrum with mild expansion of the right maxillary antrum medially. There is a retention cyst in the inferior left maxillary antrum. There is mucosal thickening in several ethmoid air cells bilaterally. No intraorbital lesions are evident. IMPRESSION: Atrophy with extensive periventricular small vessel disease. Prior infarct mid right frontal lobe. No acute infarct evident. No hemorrhage or mass effect. There is paranasal sinus disease at several sites. Question mucocele right maxillary sinus region, given mild medial expansion  of the right maxillary antrum. Electronically Signed   By: Bretta Bang III M.D.   On: 08/27/2015 10:44   Mr Maxine Glenn Head Wo Contrast  08/28/2015  CLINICAL DATA:  RIGHT-sided weakness. History of dementia. History of hypertension and previous stroke. EXAM: MRI HEAD WITHOUT AND WITH CONTRAST AND MRA HEAD WITHOUT AND WITH CONTRAST AND MRI NECK WITHOUT AND WITH CONTRAST TECHNIQUE: Multiplanar, multiecho pulse sequences of the brain and surrounding structures were obtained without and with intravenous contrast. Angiographic images of the head were obtained using MRA technique without and with contrast. Multiplanar, multiecho pulse sequences of  the neck and surrounding structures were obtained without and with intravenous contrast. CONTRAST:  16mL MULTIHANCE GADOBENATE DIMEGLUMINE 529 MG/ML IV SOLN COMPARISON:  CT head 08/27/2015. FINDINGS: The patient was unable to remain motionless for the exam. Small or subtle lesions could be overlooked. MRI HEAD FINDINGS Confluent area of restricted diffusion in the LEFT temporal lobe, LEFT occipital lobe, and insula, slight involvement of LEFT frontal operculum, representing acute LEFT MCA territory infarction. Gyriform type susceptibility pattern on gradient sequence is consistent with acute hemorrhage, not present on yesterday's CT. No midline shift. Global atrophy with hydrocephalus ex vacuo. Scattered small lacunes and small areas of cerebellar infarction. Extensive focal and confluent T2 and FLAIR hyperintensities throughout the white matter, likely chronic microvascular ischemic change. Pituitary and cerebellar tonsils unremarkable. Cervical spondylosis. Chronic sinusitis, most notable in the RIGHT maxillary region. Post infusion, there is may be slight parenchymal enhancement in the area of acute infarction. Slight intravascular enhancement is also observed in the affected vascular territory. Major dural venous sinuses are patent. MRA HEAD FINDINGS Motion degraded exam. Subtle areas of stenosis or thrombus could be overlooked. Grossly patent internal carotid arteries and basilar artery. Vertebrals patent, RIGHT dominant. No proximal large vessel occlusion. Irregularity of the BILATERAL M3 segment MCA vessels, but no proximal MCA bifurcation or trifurcation occlusion on the LEFT. Suspected infundibulum LEFT PCOM. Cerebellar branches poorly visualized due to motion degradation. Signal dropout of the ambient segment posterior cerebral arteries due to motion and in-plane flow. MRI NECK FINDINGS Conventional branching of the great vessels from the arch. No proximal great vessel narrowing. Suspected 50% stenosis at  the origin of the RIGHT ICA. No similar stenosis on the LEFT. No carotid dissection. BILATERAL vertebral artery patency is established, with ostial narrowing at the smaller LEFT vertebral origin. IMPRESSION: Acute LEFT MCA territory infarction, posterior M2 division, with reperfusion hemorrhage. Embolic stroke is suspected. Compared with yesterday's CT, no hemorrhage was apparent and the infarct itself was not visible. Atrophy and small vessel disease. No extracranial or proximal intracranial vascular occlusion or flow reducing stenosis. These results were called by telephone at the time of interpretation on 08/28/2015 at 11:00 am to Dr. Hyman Hopes, who verbally acknowledged these results. Electronically Signed   By: Elsie Stain M.D.   On: 08/28/2015 11:01   Mr Angiogram Neck W Wo Contrast  08/28/2015  CLINICAL DATA:  RIGHT-sided weakness. History of dementia. History of hypertension and previous stroke. EXAM: MRI HEAD WITHOUT AND WITH CONTRAST AND MRA HEAD WITHOUT AND WITH CONTRAST AND MRI NECK WITHOUT AND WITH CONTRAST TECHNIQUE: Multiplanar, multiecho pulse sequences of the brain and surrounding structures were obtained without and with intravenous contrast. Angiographic images of the head were obtained using MRA technique without and with contrast. Multiplanar, multiecho pulse sequences of the neck and surrounding structures were obtained without and with intravenous contrast. CONTRAST:  16mL MULTIHANCE GADOBENATE DIMEGLUMINE 529 MG/ML IV SOLN COMPARISON:  CT head 08/27/2015. FINDINGS: The patient was unable to remain motionless for the exam. Small or subtle lesions could be overlooked. MRI HEAD FINDINGS Confluent area of restricted diffusion in the LEFT temporal lobe, LEFT occipital lobe, and insula, slight involvement of LEFT frontal operculum, representing acute LEFT MCA territory infarction. Gyriform type susceptibility pattern on gradient sequence is consistent with acute hemorrhage, not present on  yesterday's CT. No midline shift. Global atrophy with hydrocephalus ex vacuo. Scattered small lacunes and small areas of cerebellar infarction. Extensive focal and confluent T2 and FLAIR hyperintensities throughout the white matter, likely chronic microvascular ischemic change. Pituitary and cerebellar tonsils unremarkable. Cervical spondylosis. Chronic sinusitis, most notable in the RIGHT maxillary region. Post infusion, there is may be slight parenchymal enhancement in the area of acute infarction. Slight intravascular enhancement is also observed in the affected vascular territory. Major dural venous sinuses are patent. MRA HEAD FINDINGS Motion degraded exam. Subtle areas of stenosis or thrombus could be overlooked. Grossly patent internal carotid arteries and basilar artery. Vertebrals patent, RIGHT dominant. No proximal large vessel occlusion. Irregularity of the BILATERAL M3 segment MCA vessels, but no proximal MCA bifurcation or trifurcation occlusion on the LEFT. Suspected infundibulum LEFT PCOM. Cerebellar branches poorly visualized due to motion degradation. Signal dropout of the ambient segment posterior cerebral arteries due to motion and in-plane flow. MRI NECK FINDINGS Conventional branching of the great vessels from the arch. No proximal great vessel narrowing. Suspected 50% stenosis at the origin of the RIGHT ICA. No similar stenosis on the LEFT. No carotid dissection. BILATERAL vertebral artery patency is established, with ostial narrowing at the smaller LEFT vertebral origin. IMPRESSION: Acute LEFT MCA territory infarction, posterior M2 division, with reperfusion hemorrhage. Embolic stroke is suspected. Compared with yesterday's CT, no hemorrhage was apparent and the infarct itself was not visible. Atrophy and small vessel disease. No extracranial or proximal intracranial vascular occlusion or flow reducing stenosis. These results were called by telephone at the time of interpretation on 08/28/2015  at 11:00 am to Dr. Hyman Hopes, who verbally acknowledged these results. Electronically Signed   By: Elsie Stain M.D.   On: 08/28/2015 11:01   Mr Laqueta Jean ZO Contrast  08/28/2015  CLINICAL DATA:  RIGHT-sided weakness. History of dementia. History of hypertension and previous stroke. EXAM: MRI HEAD WITHOUT AND WITH CONTRAST AND MRA HEAD WITHOUT AND WITH CONTRAST AND MRI NECK WITHOUT AND WITH CONTRAST TECHNIQUE: Multiplanar, multiecho pulse sequences of the brain and surrounding structures were obtained without and with intravenous contrast. Angiographic images of the head were obtained using MRA technique without and with contrast. Multiplanar, multiecho pulse sequences of the neck and surrounding structures were obtained without and with intravenous contrast. CONTRAST:  16mL MULTIHANCE GADOBENATE DIMEGLUMINE 529 MG/ML IV SOLN COMPARISON:  CT head 08/27/2015. FINDINGS: The patient was unable to remain motionless for the exam. Small or subtle lesions could be overlooked. MRI HEAD FINDINGS Confluent area of restricted diffusion in the LEFT temporal lobe, LEFT occipital lobe, and insula, slight involvement of LEFT frontal operculum, representing acute LEFT MCA territory infarction. Gyriform type susceptibility pattern on gradient sequence is consistent with acute hemorrhage, not present on yesterday's CT. No midline shift. Global atrophy with hydrocephalus ex vacuo. Scattered small lacunes and small areas of cerebellar infarction. Extensive focal and confluent T2 and FLAIR hyperintensities throughout the white matter, likely chronic microvascular ischemic change. Pituitary and cerebellar tonsils unremarkable. Cervical spondylosis. Chronic sinusitis, most notable in the RIGHT maxillary region. Post infusion, there is may be slight  parenchymal enhancement in the area of acute infarction. Slight intravascular enhancement is also observed in the affected vascular territory. Major dural venous sinuses are patent. MRA HEAD  FINDINGS Motion degraded exam. Subtle areas of stenosis or thrombus could be overlooked. Grossly patent internal carotid arteries and basilar artery. Vertebrals patent, RIGHT dominant. No proximal large vessel occlusion. Irregularity of the BILATERAL M3 segment MCA vessels, but no proximal MCA bifurcation or trifurcation occlusion on the LEFT. Suspected infundibulum LEFT PCOM. Cerebellar branches poorly visualized due to motion degradation. Signal dropout of the ambient segment posterior cerebral arteries due to motion and in-plane flow. MRI NECK FINDINGS Conventional branching of the great vessels from the arch. No proximal great vessel narrowing. Suspected 50% stenosis at the origin of the RIGHT ICA. No similar stenosis on the LEFT. No carotid dissection. BILATERAL vertebral artery patency is established, with ostial narrowing at the smaller LEFT vertebral origin. IMPRESSION: Acute LEFT MCA territory infarction, posterior M2 division, with reperfusion hemorrhage. Embolic stroke is suspected. Compared with yesterday's CT, no hemorrhage was apparent and the infarct itself was not visible. Atrophy and small vessel disease. No extracranial or proximal intracranial vascular occlusion or flow reducing stenosis. These results were called by telephone at the time of interpretation on 08/28/2015 at 11:00 am to Dr. Hyman Hopes, who verbally acknowledged these results. Electronically Signed   By: Elsie Stain M.D.   On: 08/28/2015 11:01   Medications: I have reviewed the patient's current medications. Scheduled Meds: . aspirin  300 mg Rectal Daily  . OLANZapine  2.5 mg Intramuscular Once   Continuous Infusions: . sodium chloride 125 mL/hr at 08/29/15 0355   PRN Meds:.LORazepam Assessment/Plan: Acute left MCA infarct: MRI demonstrated moderate sized infarct of left parietal lobe in posterior L MCA distribution. There was also some reperfusion hemorrhage noted. These radiographic findings are suggestive of possible  embolic etiology for her CVA. ECHO w/ no source of embolism or PFO.  -Neurology service recommendations much appreciated, LE dopplers pending.  -Holding any A/C for hemorrhage -PT consult, OT consult -Cardiac monitoring  Hypertension: on HCTZ 25mg  and lisinopril 5mg . Allowing permissive HTN at this time in setting of acute CVA. -Hold home lisinopril, HCTZ -Tolerate BP <220/110 at this time, will advance goals for days 3-7  Alzheimers dementia: spoke with family today about her baseline status which is mostly nonverbal and requiring full assistance for ADLs at her nursing facility. She is confused, easily startled, not following commands, minimally verbal, and was briefly combative with staff this morning from disorientation and required temporary restraint for continued medical therapy.  Given her advanced underlying dementia she is suffering at least in part from delirium that is likely continue throughout her hospitalization. Her prognosis is poor overall and will also depend on her ability to swallow safely in determining her remaining quality of life. Ideally she would be stabilized and tolerating PO intake and could be discharged back to her nursing facility.  Family is very reasonable with their expectations and want to minimize her suffering or futile medical treatments in the future. We will have another discussion with the family after more clarification of her functional capacity with therapists today. -resume namenda and aricept -May require additional ativan PRN to tolerate additional CVA workup or safely administer medical therapies -Consult to palliative care service for family meeting in the next 1-2 days as prognosis and workup is completed  FULL CODE Diet: reg, thin liquids when fully alert VTE ppx: SCDs  Dispo: Disposition is deferred at this time,  awaiting improvement of current medical problems. Anticipated discharge in approximately 2-3 day(s).   The patient does have a  current PCP Elinor Dodge(Fernando A Sanchez-Brugal, MD)  The patient does have transportation limitations that hinder transportation to clinic appointments.  LOS: 1 day   [place]enton Brickiana M Truong, MD 08/29/2015, 9:12 AM

## 2015-08-29 NOTE — Evaluation (Signed)
Occupational Therapy Evaluation Patient Details Name: Glenda Coleman MRN: [id number] DOB: 07/04/1945 Today's Date: 08/29/2015    History of Present Illness 70 y/o woman with PMHx significant for hypertension and advanced alzheimers dementia presents to the ED with complete right sided weakness and hemineglect. MRI on 6/17 + for acute LEFT MCA territory infarction.    Clinical Impression   Pts son reports pt was able to dress herself and required supervision for bathing 2 x per week PTA. Currently pt requires mod assist +2 for stand pivot transfer to [facility] with max multimodal cueing for initiation and sequencing. Pt requires mod-max assist for ADLs at this time but was participating in peri care with max verbal cues. Recommending SNF for follow up to maximize independence and safety with ADLs and functional mobility. Pt would benefit from continued skilled OT to address established goals.    Follow Up Recommendations  SNF;Supervision/Assistance - 24 hour    Equipment Recommendations  Other (comment) (TBD at next venue)    Recommendations for Other Services       Precautions / Restrictions Precautions Precautions: Fall Restrictions Weight Bearing Restrictions: No      Mobility Bed Mobility Overal bed mobility: Needs Assistance Bed Mobility: Supine to Sit;Sit to Supine     Supine to sit: Mod assist Sit to supine: Min assist   General bed mobility comments: Max cues for initiation and task completion. Patient easily distracted, multi modal cues and encouragement to engage  Transfers Overall transfer level: Needs assistance   Transfers: Sit to/from Stand;Stand Pivot Transfers Sit to Stand: Mod assist Stand pivot transfers: Mod assist;+2 physical assistance       General transfer comment: moderate assist to power up to standing, moderate assist for stability, 2 person moderate assist for transfer to [facility] and back to bed. increased time to perform with cues for positioning and  task initiation    Balance Overall balance assessment: Needs assistance Sitting-balance support: Bilateral upper extremity supported;Feet supported Sitting balance-Leahy Scale: Fair Sitting balance - Comments: could sit EOB without physical assist     Standing balance-Leahy Scale: Poor Standing balance comment: required assist for stability due to LE deficits                            ADL Overall ADL's : Needs assistance/impaired     Grooming: Moderate assistance;Cueing for sequencing;Sitting Grooming Details (indicate cue type and reason): Max cues for initiation and sequencing. Upper Body Bathing: Moderate assistance;Cueing for sequencing;Sitting Upper Body Bathing Details (indicate cue type and reason): Max cues for initiation and sequencing. Lower Body Bathing: Maximal assistance;Sit to/from stand Lower Body Bathing Details (indicate cue type and reason): Max cues for initiation and sequencing. Upper Body Dressing : Moderate assistance;Sitting;Cueing for sequencing Upper Body Dressing Details (indicate cue type and reason): Max cues for initiation and sequencing to doff/don hospital gown. Lower Body Dressing: Maximal assistance;Sit to/from stand;Cueing for sequencing Lower Body Dressing Details (indicate cue type and reason): Max cues for initiation and sequencing. Toilet Transfer: Moderate assistance;+2 for physical assistance;Stand-pivot;BSC;Cueing for sequencing;Cueing for safety Toilet Transfer Details (indicate cue type and reason): Max cues for initiation and sequencing. Toileting- Clothing Manipulation and Hygiene: Maximal assistance;Sit to/from stand;Cueing for sequencing Toileting - Clothing Manipulation Details (indicate cue type and reason): Max cues for initiation and sequencing. Pt attempting to assist with peri care.     Functional mobility during ADLs: Moderate assistance;+2 for physical assistance (for stand pivot only) General ADL Comments:  Consistent cueing required for initiation and sequencing of tasks. Pt not consistently following simple one step commands. Pt found incontinent of urine in bed, unable to identify she needed to have a BM but was able to on [facility].     Vision Additional Comments: Difficult to assess due to impaired cognition. Seems to have L bias.   Perception     Praxis      Pertinent Vitals/Pain Pain Assessment: Faces Faces Pain Scale: Hurts little more Pain Location: abdominal pain Pain Descriptors / Indicators: Grimacing Pain Intervention(s): Monitored during session     Hand Dominance     Extremity/Trunk Assessment Upper Extremity Assessment Upper Extremity Assessment: RUE deficits/detail;Difficult to assess due to impaired cognition   Lower Extremity Assessment Lower Extremity Assessment: RLE deficits/detail;Difficult to assess due to impaired cognition   Cervical / Trunk Assessment Cervical / Trunk Assessment: Kyphotic   Communication Communication Communication: Expressive difficulties   Cognition Arousal/Alertness: Awake/alert Behavior During Therapy: Restless;Flat affect Overall Cognitive Status: History of cognitive impairments - at baseline                     General Comments       Exercises       Shoulder Instructions      Home Living Family/patient expects to be discharged to:: Skilled nursing facility [facility])                                 Additional Comments: Pts son reports pt was living in assisted living at [facility] up until about a week ago when she was moved to skilled side of facility.       Prior Functioning/Environment Level of Independence: Needs assistance  Gait / Transfers Assistance Needed: Ambulatory PTA. ADL's / Homemaking Assistance Needed: Pt participating in ADLs PTA with assist. Could get herself dressed, assist with bathing 2x per week. Communication / Swallowing Assistance Needed: Appropriately follow  commands.      OT Diagnosis: Generalized weakness;Cognitive deficits;Disturbance of vision;Acute pain;Hemiplegia dominant side;Altered mental status   OT Problem List: Decreased activity tolerance;Impaired balance (sitting and/or standing);Impaired vision/perception;Decreased coordination;Decreased cognition;Decreased safety awareness;Decreased knowledge of use of DME or AE;Decreased knowledge of precautions;Impaired UE functional use;Pain   OT Treatment/Interventions: Self-care/ADL training;Neuromuscular education;Energy conservation;DME and/or AE instruction;Therapeutic activities;Cognitive remediation/compensation;Visual/perceptual remediation/compensation;Patient/family education;Balance training    OT Goals(Current goals can be found in the care plan section) Acute Rehab OT Goals Patient Stated Goal: per son, to get back to mobility level OT Goal Formulation: With family Time For Goal Achievement: 09/12/15 Potential to Achieve Goals: Fair ADL Goals Pt Will Perform Grooming: with min assist;sitting Pt Will Perform Upper Body Bathing: with min assist;sitting Pt Will Perform Lower Body Bathing: with min assist;sit to/from stand Pt Will Transfer to Toilet: with min assist;ambulating;regular height toilet Pt Will Perform Toileting - Clothing Manipulation and hygiene: with min assist;sit to/from stand  OT Frequency: Min 2X/week   Barriers to D/C:            Co-evaluation PT/OT/SLP Co-Evaluation/Treatment: Yes Reason for Co-Treatment: Complexity of the patient's impairments (multi-system involvement);For patient/therapist safety   OT goals addressed during session: ADL's and self-care;Other (comment) (functional mobility)      End of Session Equipment Utilized During Treatment: Gait belt Nurse Communication: Mobility status;Other (comment)  Activity Tolerance: Patient tolerated treatment well Patient left: in bed;with call bell/phone within reach;with bed alarm set   Time:  8119-1478 OT Time Calculation (min):  25 min Charges:  OT General Charges $OT Visit: 1 Procedure OT Evaluation $OT Eval High Complexity: 1 Procedure G-Codes:     Gaye AlkenBailey A Meet Weathington M.S., OTR/L Pager: [phone]  08/29/2015, 3:47 PM

## 2015-08-29 NOTE — Progress Notes (Signed)
STROKE TEAM PROGRESS NOTE   SUBJECTIVE (INTERVAL HISTORY) Her brother and son are at the bedside.  Overall she feels her condition is improving, more awake alert than yesterday. Still not following commands and not answer questions, but able has words out although not in sentence.    OBJECTIVE Temp:  [97.6 F (36.4 C)-99.5 F (37.5 C)] 97.9 F (36.6 C) (06/18 1038) Pulse Rate:  [58-100] 83 (06/18 1038) Cardiac Rhythm:  [-] Normal sinus rhythm (06/18 0700) Resp:  [16-20] 20 (06/18 1038) BP: (106-189)/(70-91) 161/88 mmHg (06/18 1038) SpO2:  [95 %-100 %] 100 % (06/18 1038)  CBC:   Recent Labs Lab 08/27/15 1010 08/27/15 1021  WBC 5.5  --   NEUTROABS 3.5  --   HGB 11.7* 12.9  HCT 37.3 38.0  MCV 88.0  --   PLT 272  --     Basic Metabolic Panel:   Recent Labs Lab 08/27/15 1010 08/27/15 1021  NA 139 141  K 4.4 4.3  CL 106 105  CO2 24  --   GLUCOSE 131* 132*  BUN 15 21*  CREATININE 1.08* 1.10*  CALCIUM 9.5  --     Lipid Panel:     Component Value Date/Time   CHOL 186 08/28/2015 0535   TRIG 63 08/28/2015 0535   HDL 106 08/28/2015 0535   CHOLHDL 1.8 08/28/2015 0535   VLDL 13 08/28/2015 0535   LDLCALC 67 08/28/2015 0535   HgbA1c:  Lab Results  Component Value Date   HGBA1C 6.1* 08/27/2015   Urine Drug Screen: No results found for: LABOPIA, COCAINSCRNUR, LABBENZ, AMPHETMU, THCU, LABBARB    IMAGING I have personally reviewed the radiological images below and agree with the radiology interpretations.  Ct Head Wo Contrast 08/27/2015   Atrophy with extensive periventricular small vessel disease. Prior infarct mid right frontal lobe. No acute infarct evident. No hemorrhage or mass effect. There is paranasal sinus disease at several sites. Question mucocele right maxillary sinus region, given mild medial expansion of the right maxillary antrum.   MRI MRA Head and neck Wo Contrast 08/28/2015 Acute LEFT MCA territory infarction, posterior M2 division, with  reperfusion hemorrhage.  Embolic stroke is suspected.  Compared with yesterday's CT, no hemorrhage was apparent and the infarct itself was not visible. Atrophy and small vessel disease. No extracranial or proximal intracranial vascular occlusion or flow reducing stenosis.  TTE  08/28/15 Study Conclusions - Left ventricle: The cavity size was normal. Wall thickness was  increased in a pattern of mild LVH. Systolic function was normal.  The estimated ejection fraction was in the range of 55% to 60%.  Wall motion was normal; there were no regional wall motion  abnormalities. Doppler parameters are consistent with abnormal  left ventricular relaxation (grade 1 diastolic dysfunction). - Mitral valve: There was mild to moderate regurgitation.   LE venous doppler - There is no obvious evidence of DVT or SVT noted in the bilateral lower extremities.    PHYSICAL EXAM  Temp:  [97.6 F (36.4 C)-99.5 F (37.5 C)] 97.9 F (36.6 C) (06/18 1038) Pulse Rate:  [58-100] 83 (06/18 1038) Resp:  [16-20] 20 (06/18 1038) BP: (106-189)/(70-91) 161/88 mmHg (06/18 1038) SpO2:  [95 %-100 %] 100 % (06/18 1038)  General - Well nourished, well developed, in no apparent distress, eyes closed, lethargic.  Ophthalmologic - SFundi not visualized due to eyes closed.  Cardiovascular - Regular rate and rhythm.  Neuro - eyes open, not following commands, able to have words out, but not in  sentences and makes no sense. Pt has right neglect, not blinking to visual threat on the right, but blinking on the left side. PERRL, eyes in middle position, facial symmetrical, mildly dysarthric, tongue midline inside mouth. LUE and LLE strongly against gravity and spontaneous movement, on pain stimulation, RUE 3+/5 and RLE 3-/5. DTR 1+, no increased muscle tone, babinski negative. Sensation, gait and coordination not tested.   ASSESSMENT/PLAN Glenda Coleman is a 70 y.o. female with history of Alzheimer's dementia  (nonverbal at baseline), hypertension, diabetes mellitus, previous stroke, and previous TIA, presenting with right hemiplegia. She did not receive IV t-PA due to late presentation.  Stroke:  Dominant acute left MCA territory infarct believed to be embolic from an unknown source.  Resultant  left hemiparesis  MRI - Acute LEFT MCA territory infarction, posterior M2 division, with hemorrhage transformation.   MRA head and neck - unremarkable but motion degraded  2D Echo - EF 55-60%. No cardiac source of emboli identified.  LE venous doppler no DVT  Consider 30 day cardiac event monitoring as outpt to rule out afib if desired by family.  LDL - 67  HgbA1c 6.1  VTE prophylaxis - SCDs Diet regular Room service appropriate?: Yes; Fluid consistency:: Thin  No antithrombotic prior to admission, now on aspirin 325 mg daily.   Ongoing aggressive stroke risk factor management  Therapy recommendations: Pending  Disposition: Pending  Hypertension  Stable  Permissive hypertension (OK if < 220/120) but gradually normalize in 5-7 days  Long-term BP goal normotensive  Hyperlipidemia  Home meds: Zocor 40 mg daily resumed in hospital.  LDL 67, goal < 70  Continue statin at discharge  Diabetes  HgbA1c 6.1, goal < 7.0  Controlled  Advance dementia  Recently moved from ALF to SNF due to condition decline  Not able to follow commands at baseline  Verbal output at baseline but not make sense  Resumed home meds aricept and namenda  Agree with palliative care consult of goal of care - pending tomorrow  Other Stroke Risk Factors  Advanced age  Hx stroke/TIA  Other Active Problems  Social worker to help for placement  Hospital day # 1  Neurology will sign off. Please call with questions. No neuro follow up needed at this time. Thanks for the consult.  Marvel Plan, MD PhD Stroke Neurology 08/29/2015 4:03 PM    To contact Stroke Continuity provider, please refer to  [email or web address]. After hours, contact General Neurology

## 2015-08-30 DIAGNOSIS — Z515 Encounter for palliative care: Secondary | ICD-10-CM | POA: Insufficient documentation

## 2015-08-30 DIAGNOSIS — I63412 Cerebral infarction due to embolism of left middle cerebral artery: Principal | ICD-10-CM

## 2015-08-30 DIAGNOSIS — Z7189 Other specified counseling: Secondary | ICD-10-CM

## 2015-08-30 DIAGNOSIS — F0391 Unspecified dementia with behavioral disturbance: Secondary | ICD-10-CM

## 2015-08-30 DIAGNOSIS — Z66 Do not resuscitate: Secondary | ICD-10-CM

## 2015-08-30 MED ORDER — LORAZEPAM 2 MG/ML IJ SOLN: 0.5000 mg | Freq: Four times a day (QID) | INTRAMUSCULAR | Status: DC | PRN

## 2015-08-30 MED ORDER — HALOPERIDOL LACTATE 5 MG/ML IJ SOLN
2.0000 mg | Freq: Once | INTRAMUSCULAR | Status: AC
  Administered 2015-08-30: 2 mg via INTRAMUSCULAR
  Filled 2015-08-30: qty 1

## 2015-08-30 MED ORDER — HYDROCHLOROTHIAZIDE 25 MG PO TABS
25.0000 mg | ORAL_TABLET | Freq: Every day | ORAL | Status: DC
  Administered 2015-08-30 – 2015-09-02 (×4): 25 mg via ORAL
  Filled 2015-08-30 (×4): qty 1

## 2015-08-30 MED ORDER — HALOPERIDOL 1 MG PO TABS: 2.0000 mg | ORAL_TABLET | Freq: Once | ORAL | Status: AC

## 2015-08-30 MED ORDER — RISPERIDONE 0.5 MG PO TABS
0.2500 mg | ORAL_TABLET | Freq: Every evening | ORAL | Status: DC | PRN
  Filled 2015-08-30: qty 1

## 2015-08-30 MED ORDER — ASPIRIN EC 81 MG PO TBEC
81.0000 mg | DELAYED_RELEASE_TABLET | Freq: Every day | ORAL | Status: DC
  Administered 2015-08-31 – 2015-09-02 (×3): 81 mg via ORAL
  Filled 2015-08-30 (×3): qty 1

## 2015-08-30 NOTE — Care Management Important Message (Signed)
Important Message  Patient Details  Name: Glenda Coleman MRN: [id number] Date of Birth: 20-Apr-1945   Medicare Important Message Given:  Yes    [name], [name] 08/30/2015, 10:21 AM

## 2015-08-30 NOTE — Progress Notes (Signed)
Patient is from [facility] but family would like her to go to SNF.  Osborne Cascoadia Ade Stmarie LCSWA [phone]

## 2015-08-30 NOTE — Progress Notes (Signed)
Internal Medicine Attending:   I saw and examined the patient. I reviewed the resident's note and I agree with the resident's findings and plan as documented in the resident's note.  70 -year-old woman admitted with right-sided hemi-neglect due to an acute left MCA infarction which has had hemorrhagic conversion. On exam this morning the patient was in the chair and resting comfortably. She had no acute complaints, endorse being hungry. Transfer from bed to commode with assistance, walked with two point PT assistance and was unsteady on her feet. No sign of atrial fibrillation on telemetry. I updated the patient's brother at the bedside. Plan is to transition her to outpatient skilled nursing facility for subacute rehabilitation. It's unclear if she will return enough functional capacity to live in a memory unit in the future. Palliative care talked with the patient's family and completed advanced care planning.   For the acute CVA with reperfusion hemorrhage, it is hard to know when to start antiplatelet therapy. [facility] has received full dose daily aspirin since 6/16 without problem. It is probably ok to use low dose aspirin 81mg  daily starting now. The appearance on MRI raises the possibility of embolic source from underlying A Fib. I think it is reasonable to arrange for a 30 day monitor after discharge as she could be a candidate for anticoagulation once the hemorrhage resolves.

## 2015-08-30 NOTE — NC FL2 (Signed)
   MEDICAID FL2 LEVEL OF CARE SCREENING TOOL     IDENTIFICATION  Patient Name: Glenda GlassingMary Coleman Birthdate: [birth year] Sex: female Admission Date (Current Location): 08/27/2015  [facility] [place]   Facility and Address:  [facility], [address], [place], [place] [address]      Provider Number: [id number]  Attending Physician Name and Address:  [name], MD  Relative Name and Phone Number:  Glenda Coleman, brother, [phone]    Current Level of Care: Hospital Recommended Level of Care: Skilled Nursing Facility Prior Approval Number:    Date Approved/Denied:   PASRR Number: [id number]  Discharge Plan: SNF    Current Diagnoses: Patient Active Problem List   Diagnosis Date Noted  . Acute CVA (cerebrovascular accident) (HCC) 08/28/2015  . Stroke (cerebrum) (HCC)   . Dysphagia, pharyngoesophageal phase   . Essential hypertension   . HLD (hyperlipidemia)   . Dementia   . CVA (cerebral vascular accident) (HCC) 08/27/2015    Orientation RESPIRATION BLADDER Height & Weight      (Disoriented x4)  Normal Incontinent Weight: 74.844 kg (165 lb) Height:     BEHAVIORAL SYMPTOMS/MOOD NEUROLOGICAL BOWEL NUTRITION STATUS      Incontinent Diet (Please see DC Summary)  AMBULATORY STATUS COMMUNICATION OF NEEDS Skin   Extensive Assist Verbally Normal                       Personal Care Assistance Level of Assistance  Bathing, Feeding, Dressing Bathing Assistance: Maximum assistance Feeding assistance: Maximum assistance Dressing Assistance: Maximum assistance     Functional Limitations Info             SPECIAL CARE FACTORS FREQUENCY  PT (By licensed PT), OT (By licensed OT)     PT Frequency: 5x./week OT Frequency: min 2x/week            Contractures      Additional Factors Info  Code Status, Allergies, Psychotropic Code Status Info: Full Allergies Info: NKA Psychotropic Info: sertraline  (ZOLOFT) tablet 100 mg         Current Medications (08/30/2015):  This is the current hospital active medication list Current Facility-Administered Medications  Medication Dose Route Frequency Provider Last Rate Last Dose  . 0.9 %  sodium chloride infusion   Intravenous Continuous Denton Brickiana M Truong, MD 50 mL/hr at 08/29/15 [id number]    . hydrochlorothiazide (HYDRODIURIL) tablet 25 mg  25 mg Oral Daily Fuller Planhristopher W Rice, MD      . LORazepam (ATIVAN) injection 0.5 mg  0.5 mg Intravenous Q6H PRN Fuller Planhristopher W Rice, MD      . OLANZapine [facility](ZYPREXA) injection 2.5 mg  2.5 mg Intramuscular Once Lavera Guiseana Duo Liu, MD      . sertraline (ZOLOFT) tablet 100 mg  100 mg Oral Daily Denton Brickiana M Truong, MD   100 mg at 08/30/15 0836  . simvastatin (ZOCOR) tablet 40 mg  40 mg Oral q1800 Denton Brickiana M Truong, MD   40 mg at 08/29/15 1646     Discharge Medications: Please see discharge summary for a list of discharge medications.  Relevant Imaging Results:  Relevant Lab Results:   Additional Information SSN: [id number]  Glenda Marciel S MitchellvilleRayyan, [place]WA

## 2015-08-30 NOTE — Consult Note (Signed)
Consultation Note Date: 08/30/2015   Patient Name: Glenda Coleman  DOB: 14-May-1945  MRN: [id number]  Age / Sex: 70 y.o., female  PCP: Rocky Morel, MD Referring Physician: Axel Filler, MD  Reason for Consultation: Establishing goals of care  HPI/Patient Profile: 70 y.o. female  with past medical history of Alzheimer's dementia, chronic low back pain, DM who was  admitted on 08/27/2015 with a left MCA infarct.   Clinical Assessment and Goals of Care: I met with Kerry Dory, brother and HCPOA, and Linton Rump, son, at bedside.  Kerry Dory has experience caring for family members who have passed before, and Linton Rump used to work in a memory unit, so both gentlemen are very thoughtful and reasonable in making decisions for Glenda Coleman.  Per Kerry Dory, they want her to have the very best care possible until her body no longer responds to the treatments, then they want her to have a comfortable passing.    We discussed what end stage dementia looks like (bed bound, recurrent UTI, recurrent aspiration) - both gentlemen believe they will recognize it when Glenda Coleman is there.    We then discussed code status and the MOST form.  They agree to DNR.   Most form was completed, copied and placed on the chart.  It was a pleasure working with the Stark family.  We will follow while she is in house in case we may be of assistance.  HCPOA:  Calvin (Brother)    SUMMARY OF RECOMMENDATIONS     DNR / DNI  Full scope treatment otherwise  Family refuses artificial feeding   Avoid ICU.  Recommend the patient be followed by Palliative Medicine outpatient at SNF   Symptom Management:   Well managed by primary team.  Palliative Prophylaxis:   Aspiration, Delirium Protocol and Turn Reposition  Additional Recommendations (Limitations, Scope, Preferences):  Full Scope Treatment and No Artificial  Feeding  Psycho-social/Spiritual:   Desire for further Chaplaincy support:yes  Additional Recommendations: Caregiving  Support/Resources  Prognosis:   < 12 months based on Alzheimer's dementia, recurrent UTIs, right sided hemiplegia post stroke.  Discharge Planning: [facility] for rehab with Palliative care service follow-up      Primary Diagnoses: Present on Admission:  . CVA (cerebral vascular accident) ([place]) . Acute CVA (cerebrovascular accident) ([place])  I have reviewed the medical record, interviewed the patient and family, and examined the patient. The following aspects are pertinent.  Past Medical History  Diagnosis Date  . Hypertension   . Type II diabetes mellitus ([place])   . TIA (transient ischemic attack) dx'd 2015    "dr said she'd had a few; put her on aspirin at that point"  . Stroke ([place]) 08/27/2015  . Arthritis     "back" (08/27/2015)  . Chronic lower back pain   . Depression   . Alzheimer disease   . Dementia     "severe" (08/27/2015)   Social History   Social History  . Marital Status: Divorced    Spouse Name: N/A  . Number  of Children: N/A  . Years of Education: N/A   Social History Main Topics  . Smoking status: Former Smoker -- 25 years    Types: Cigarettes  . Smokeless tobacco: Former Systems developer    Types: Chew     Comment: "stoped smoking cigarettes & chewng in the 1980s"  . Alcohol Use: Yes     Comment: 08/27/2015 "quit in the 1970s"  . Drug Use: No  . Sexual Activity: No   Other Topics Concern  . None   Social History Narrative   History reviewed. No pertinent family history. Scheduled Meds: . hydrochlorothiazide  25 mg Oral Daily  . OLANZapine  2.5 mg Intramuscular Once  . sertraline  100 mg Oral Daily  . simvastatin  40 mg Oral q1800   Continuous Infusions: . sodium chloride 50 mL/hr at 08/29/15 0937   PRN Meds:.LORazepam Medications Prior to Admission:  Prior to Admission medications   Medication Sig Start Date End  Date Taking? Authorizing Provider  acetaminophen (TYLENOL) 500 MG tablet Take 500 mg by mouth every 6 (six) hours as needed for moderate pain.   Yes Historical Provider, MD  ALPRAZolam (XANAX) 0.25 MG tablet Take 0.25 mg by mouth every 6 (six) hours as needed for anxiety.   Yes Historical Provider, MD  amoxicillin (AMOXIL) 500 MG capsule Take 2,000 mg by mouth See admin instructions. Take 4 capsules prior to dental appts   Yes Historical Provider, MD  Biotin 5 MG TABS Take 5 mg by mouth daily.   Yes Historical Provider, MD  calcium-vitamin D (OSCAL WITH D) 500-200 MG-UNIT tablet Take 1 tablet by mouth 2 (two) times daily.   Yes Historical Provider, MD  donepezil (ARICEPT) 10 MG tablet Take 20 mg by mouth daily.  09/24/14  Yes Historical Provider, MD  hydrochlorothiazide (HYDRODIURIL) 25 MG tablet Take 25 mg by mouth daily. 08/28/14  Yes Historical Provider, MD  HYDROcodone-acetaminophen (NORCO/VICODIN) 5-325 MG tablet Take 1 tablet by mouth 3 (three) times daily as needed for moderate pain.   Yes Historical Provider, MD  lisinopril (PRINIVIL,ZESTRIL) 5 MG tablet Take 5 mg by mouth daily. 09/08/14  Yes Historical Provider, MD  memantine (NAMENDA) 10 MG tablet Take 10 mg by mouth daily.  09/30/14  Yes Historical Provider, MD  Multiple Vitamin (DAILY VITE PO) Take 1 tablet by mouth daily.   Yes Historical Provider, MD  naproxen (NAPROSYN) 500 MG tablet Take 500 mg by mouth 2 (two) times daily with a meal.   Yes Historical Provider, MD  Omega-3 Fatty Acids (FISH OIL) 1000 MG CAPS Take 2,000 mg by mouth daily.   Yes Historical Provider, MD  sertraline (ZOLOFT) 100 MG tablet Take 100 mg by mouth daily.   Yes Historical Provider, MD  simvastatin (ZOCOR) 40 MG tablet Take 40 mg by mouth daily. 09/15/14  Yes Historical Provider, MD   No Known Allergies Review of Systems:  Patient sleeping soundly and is essentially non verbal.  Physical Exam Wd, Wn female, sleeping soundly.  She does not wake to my exam. CV  rrr Resp clear.  Does not follow commands to inspire deeply Abdomen soft, nt, nd, +bs Extremities:  Rt lower extremity slightly colder than left.  1+ edema bilaterally.  Vital Signs: BP 159/94 mmHg  Pulse 69  Temp(Src) 97.5 F (36.4 C) (Axillary)  Resp 20  Wt 74.844 kg (165 lb)  SpO2 100% Pain Assessment: No/denies pain   Pain Score: 0-No pain   SpO2: SpO2: 100 % O2 Device:SpO2: 100 % O2  Flow Rate: .   IO: Intake/output summary: No intake or output data in the 24 hours ending 08/30/15 1246  LBM: Last BM Date: 08/26/15 Baseline Weight: Weight: 74.844 kg (165 lb) Most recent weight: Weight: 74.844 kg (165 lb)     Palliative Assessment/Data:   Flowsheet Rows        Most Recent Value   Intake Tab    Unit at Time of Referral  Other (Comment)   Palliative Care Primary Diagnosis  Cancer   Date Notified  08/30/15   Palliative Care Type  New Palliative care   Reason for referral  Clarify Goals of Care   Date of Admission  08/27/15   Date first seen by Palliative Care  08/30/15   # of days Palliative referral response time  0 Day(s)   # of days IP prior to Palliative referral  3   Clinical Assessment    Palliative Performance Scale Score  20%   Psychosocial & Spiritual Assessment    Palliative Care Outcomes    Patient/Family meeting held?  Yes   Who was at the meeting?  brother calvin, HCPOA, and son maurice   Palliative Care Outcomes  Clarified goals of care   Patient/Family wishes: Interventions discontinued/not started   Mechanical Ventilation, Trach, PEG, Tube feedings/TPN   Palliative Care follow-up planned  Yes, Facility      Time In: 11:00  Time Out: 11:50 Time Total: 50 min. Greater than 50%  of this time was spent counseling and coordinating care related to the above assessment and plan.  Signed by: Imogene Burn, PA-C Palliative Medicine Pager: [phone]   Please contact Palliative Medicine Team phone at [phone] for questions and concerns.  For  individual provider: See Shea Evans

## 2015-08-30 NOTE — Clinical Social Work Placement (Signed)
   CLINICAL SOCIAL WORK PLACEMENT  NOTE  Date:  08/30/2015  Patient Details  Name: Glenda Coleman MRN: [id number] Date of Birth: 07/26/1945  Clinical Social Work is seeking post-discharge placement for this patient at the Skilled  Nursing Facility level of care (*CSW will initial, date and re-position this form in  chart as items are completed):      Patient/family provided with [facility] Clinical Social Work Department's list of facilities offering this level of care within the geographic area requested by the patient (or if unable, by the patient's family).      Patient/family informed of their freedom to choose among providers that offer the needed level of care, that participate in Medicare, Medicaid or managed care program needed by the patient, have an available bed and are willing to accept the patient.      Patient/family informed of [facility]'s ownership interest in [facility] and [facility], as well as of the fact that they are under no obligation to receive care at these facilities.  PASRR submitted to EDS on 08/30/15     PASRR number received on 08/30/15     Existing PASRR number confirmed on       FL2 transmitted to all facilities in geographic area requested by pt/family on 08/30/15     FL2 transmitted to all facilities within larger geographic area on       Patient informed that his/her managed care company has contracts with or will negotiate with certain facilities, including the following:            Patient/family informed of bed offers received.  Patient chooses bed at       Physician recommends and patient chooses bed at      Patient to be transferred to   on  .  Patient to be transferred to facility by       Patient family notified on   of transfer.  Name of family member notified:        PHYSICIAN Please sign FL2     Additional Comment:    _______________________________________________ Mearl LatinNadia S Chenel Wernli, LCSWA 08/30/2015, 11:42  AM

## 2015-08-30 NOTE — Progress Notes (Signed)
  Subjective: Glenda Coleman is a 70 y/o woman with Alzheimer's disease and hypertension presenting for ischemic stroke. There were no major events overnight. PT/OT consult recommend SNF placement. She passed her swallow study. This morning she is sitting in her chair and calm.  Objective: Vital signs in last 24 hours: Filed Vitals:   08/29/15 2035 08/30/15 0129 08/30/15 0131 08/30/15 0654  BP: 188/91 167/135 188/94 162/90  Pulse: 66  78 59  Temp: 98.6 F (37 C)  98.4 F (36.9 C) 98 F (36.7 C)  TempSrc: Axillary  Axillary Axillary  Resp: 20  20 16   Weight:      SpO2: 99%  100% 99%   Physical Exam General: In no apparent distress. Sitting in chair. CV: RRR. Normal S1/S2. No m/r/g. Pulm: CTAB. No wheezes, rales, rhonchi. Abdomen: Soft, NT/ND. Positive bowel sounds. Ext: No edema. Neuro: Right-sided hemineglect. Not following commands. Motor and sensory function diminished on right side.  Lab Results: Fasting Lipid Panel:  Recent Labs  08/28/15 0535  CHOL 186  HDL 106  LDLCALC 67  TRIG 63  CHOLHDL 1.8   Urinalysis:  Recent Labs  08/27/15 1109  COLORURINE YELLOW  LABSPEC 1.019  PHURINE 7.0  GLUCOSEU NEGATIVE  HGBUR NEGATIVE  BILIRUBINUR NEGATIVE  KETONESUR NEGATIVE  PROTEINUR 30*  NITRITE NEGATIVE  LEUKOCYTESUR NEGATIVE   Medications: I have reviewed the patient's current medications. Scheduled Meds: . aspirin EC  325 mg Oral Daily  . donepezil  20 mg Oral Daily  . memantine  10 mg Oral Daily  . OLANZapine  2.5 mg Intramuscular Once  . sertraline  100 mg Oral Daily  . simvastatin  40 mg Oral q1800   Continuous Infusions: . sodium chloride 50 mL/hr at 08/29/15 0937   PRN Meds:.LORazepam Assessment/Plan: Active Problems:   CVA (cerebral vascular accident) (HCC)   Stroke (cerebrum) (HCC)   Dysphagia, pharyngoesophageal phase   Essential hypertension   HLD (hyperlipidemia)   Dementia   Acute CVA (cerebrovascular accident) (HCC)  Stroke: MRI shows L  MCA infarction with hemorrhagic transformation. CT did not show active infarction but revealed prior R frontal middle lobe infarct. Echo showed 55-60% EF and no cardiac emboli. LE venous doppler did not show DVT/SVT. VTE ppx and ASA have been discontinued in setting of hemorrhagic transformation. - Restart ASA in 1-2 wk. - Telemetry - Consult social work for SNF placement. - May require ativan PRN.  Alzheimer's: Baseline is nonverbal and requiring assistance for all ADLs. She has less delirium today and is more responsive. - Holding donepezil, memantine, and sertraline because of possible effects on delirium.  Hypertension: Permissive hypertension to systolic 160. BP 188/94. - Restart HCTZ 25 mg  Dysplipidemia: - Zocar 40 mg  This is a Psychologist, occupationalMedical Student Note.  The care of the patient was discussed with Dr. Sheliah Hatchhristopher Rice and the assessment and plan formulated with their assistance.  Please see their attached note for official documentation of the daily encounter.   LOS: 2 days   Glenda Coleman, Med Student 08/30/2015, 10:27 AM

## 2015-08-30 NOTE — Evaluation (Signed)
Speech Language Pathology Evaluation Patient Details Name: Glenda GlassingMary Coleman MRN: [id number] DOB: 12/30/1945 Today's Date: 08/30/2015 Time: [id number] SLP Time Calculation (min) (ACUTE ONLY): 19 min  Problem List:  Patient Active Problem List   Diagnosis Date Noted  . DNR (do not resuscitate)   . Palliative care encounter   . Goals of care, counseling/discussion   . Acute CVA (cerebrovascular accident) (HCC) 08/28/2015  . Stroke (cerebrum) (HCC)   . Dysphagia, pharyngoesophageal phase   . Essential hypertension   . HLD (hyperlipidemia)   . Dementia   . CVA (cerebral vascular accident) (HCC) 08/27/2015   Past Medical History:  Past Medical History  Diagnosis Date  . Hypertension   . Type II diabetes mellitus (HCC)   . TIA (transient ischemic attack) dx'd 2015    "dr said she'd had a few; put her on aspirin at that point"  . Stroke (HCC) 08/27/2015  . Arthritis     "back" (08/27/2015)  . Chronic lower back pain   . Depression   . Alzheimer disease   . Dementia     "severe" (08/27/2015)   Past Surgical History:  Past Surgical History  Procedure Laterality Date  . Partial knee arthroplasty Left   . Abdominal hysterectomy     HPI:  Pt is a 70 y.o. female with PMH of Alzheimer's disease and hypertension who presented on 6/16 for flaccid right side. MRI 6/17 showed acute L CVA affecting temporal/ occipital lobes and insula/ slight involvement of L frontal operculum with reperfusion hemorrhage. According to SNF, at baseline pt is nonverbal and ambulatory. Palliative consult 6/19. Speech/language eval ordered as part of stroke workup.    Assessment / Plan / Recommendation Clinical Impression  Pt currently demonstrating severe cognitive-linguistic impairments- no family at bedside during this evaluation to determine baseline; NT reported pt seemed to have increased confusion as the day went on. Pt able to answer yes/ no questions with visual cues (e.g., "do you want something to drink"  and holding out cup) but without visual/ gestural cues not able to comprehend verbal information and was frustrated with inability to do so. Unable to follow simple 1-step commands, unable to state last name and date of birth despite cues- appeared confused/ worried when presented with multiple questions. Needed frequent reminders of location/ situation. Initiated sentences/ questions but then either "trailed off" or appeared frustrated with word finding difficulties and stopped. Motor speech was within functional limits. Given that it is unclear whether pt is at baseline, will continue to follow briefly to obtain additional information and provide family education for functional communication techniques while pt is in acute care.    SLP Assessment  Patient needs continued Speech Lanaguage Pathology Services    Follow Up Recommendations  24 hour supervision/assistance    Frequency and Duration min 1 x/week  1 week      SLP Evaluation Prior Functioning  Cognitive/Linguistic Baseline: Baseline deficits Baseline deficit details: end-stage dementia- reportedly nonverbal at baseline Type of Home: Skilled Nursing Facility   Cognition  Overall Cognitive Status: No family/caregiver present to determine baseline cognitive functioning Arousal/Alertness: Awake/alert Orientation Level: Oriented to person;Disoriented to place;Disoriented to time;Disoriented to situation Attention: Focused;Sustained Focused Attention: Appears intact Sustained Attention: Impaired Sustained Attention Impairment: Verbal basic;Functional basic Memory: Impaired Memory Impairment: Decreased short term memory;Decreased recall of new information Decreased Short Term Memory: Verbal basic Awareness: Impaired Problem Solving: Impaired Problem Solving Impairment: Verbal basic;Functional basic Behaviors: Restless;Perseveration Safety/Judgment: Impaired    Comprehension  Auditory Comprehension Overall Auditory  Comprehension:  Impaired Yes/No Questions: Impaired Basic Biographical Questions: 0-25% accurate Commands: Impaired One Step Basic Commands: 0-24% accurate Conversation: Simple Interfering Components: Working memory EffectiveTechniques: Repetition;Slowed speech;Visual/Gestural cues    Expression Expression Primary Mode of Expression: Verbal Verbal Expression Overall Verbal Expression: Impaired Initiation: No impairment Level of Generative/Spontaneous Verbalization: Phrase (initiating sentences then trailing off) Interfering Components: Attention Non-Verbal Means of Communication: Not applicable   Oral / Motor  Oral Motor/Sensory Function Overall Oral Motor/Sensory Function: Within functional limits Motor Speech Overall Motor Speech: Appears within functional limits for tasks assessed Respiration: Within functional limits Phonation: Normal Resonance: Within functional limits Articulation: Within functional limitis Intelligibility: Intelligible Motor Planning: Witnin functional limits Motor Speech Errors: Not applicable   GO                    Metro Kung, MA, CCC-SLP 08/30/2015, 2:47 PM [phone]

## 2015-08-30 NOTE — Care Management Note (Signed)
Case Management Note  Patient Details  Name: Devoria GlassingMary Forgue MRN: [id number] Date of Birth: [birth year]  Subjective/Objective:   Pt admitted with CVA. [facility].                  Action/Plan: Recommendations are for SNF. Per RN patients family is interested in possibly changing her to new facility. CSW informed. CM following for d/c needs.   Expected Discharge Date:                  Expected Discharge Plan:  Skilled Nursing Facility  In-House Referral:     Discharge planning Services     Post Acute Care Choice:    Choice offered to:     DME Arranged:    DME Agency:     HH Arranged:    HH Agency:     Status of Service:  In process, will continue to follow  Medicare Important Message Given:  Yes Date Medicare IM Given:    Medicare IM give by:    Date Additional Medicare IM Given:    Additional Medicare Important Message give by:     If discussed at Long Length of Stay Meetings, dates discussed:    Additional Comments:  Kermit BaloKelli F Laird Runnion, RN 08/30/2015, 10:44 AM

## 2015-08-30 NOTE — Progress Notes (Addendum)
Subjective: No events overnight. Patient is calmer and more cooperative this morning but still cannot follow commands clearly. She ate a small portion of her breakfast with assistance. Soft restraints removed due to mental status improvement.  Objective: Vital signs in last 24 hours: Filed Vitals:   08/30/15 0129 08/30/15 0131 08/30/15 0654 08/30/15 1041  BP: 167/135 188/94 162/90 159/94  Pulse:  78 59 69  Temp:  98.4 F (36.9 C) 98 F (36.7 C) 97.5 F (36.4 C)  TempSrc:  Axillary Axillary Axillary  Resp:  20 16 20   Weight:      SpO2:  100% 99% 100%   Weight change:  No intake or output data in the 24 hours ending 08/30/15 1124   GENERAL- Calm, unable to follow direct commands, speech limited to short phrases and with some inappropriate responses CARDIAC- RRR, no murmurs, rubs or gallops. RESP- Anterior breath sounds clear NEURO- pt unable to follow commands, inattentive to right visual field EXTREMITIES- symmetric, no pedal edema SKIN- Warm, dry, No rash or lesion  Lab Results: Basic Metabolic Panel:  Recent Labs Lab 08/27/15 1010 08/27/15 1021  NA 139 141  K 4.4 4.3  CL 106 105  CO2 24  --   GLUCOSE 131* 132*  BUN 15 21*  CREATININE 1.08* 1.10*  CALCIUM 9.5  --    Liver Function Tests:  Recent Labs Lab 08/27/15 1010  AST 38  ALT 14  ALKPHOS 43  BILITOT 1.1  PROT 6.9  ALBUMIN 3.8   CBC:  Recent Labs Lab 08/27/15 1010 08/27/15 1021  WBC 5.5  --   NEUTROABS 3.5  --   HGB 11.7* 12.9  HCT 37.3 38.0  MCV 88.0  --   PLT 272  --    CBG:  Recent Labs Lab 08/27/15 1004  GLUCAP 131*   Hemoglobin A1C:  Recent Labs Lab 08/27/15 1010  HGBA1C 6.1*   Fasting Lipid Panel:  Recent Labs Lab 08/28/15 0535  CHOL 186  HDL 106  LDLCALC 67  TRIG 63  CHOLHDL 1.8   Medications: I have reviewed the patient's current medications. Scheduled Meds: . hydrochlorothiazide  25 mg Oral Daily  . OLANZapine  2.5 mg Intramuscular Once  . sertraline   100 mg Oral Daily  . simvastatin  40 mg Oral q1800   Continuous Infusions: . sodium chloride 50 mL/hr at 08/29/15 0937   PRN Meds:.LORazepam Assessment/Plan: Acute left MCA infarct: MRI demonstrated moderate sized infarct of left parietal lobe in posterior L MCA distribution. There was also some reperfusion hemorrhage noted. These radiographic findings are suggestive of possible embolic etiology for her CVA. ECHO w/ no source of embolism or PFO. DVT negative, no significant carotid stenosis noted. She is requiring 1-2+ assistance on her therapy and would likely benefit from acure rehab before a return to her ALF, will clarify services available with social work. Family thinks more advanced care may be available at a different facility that what she is needing at baseline before. -Neurology service recommendations much appreciated -Based on rehab performance may plan SNF rehab before return to her original facility, will consult social work -May resume antiplatelet therapy on daily ASA given no sign of ongoing or increased hemorrhage  Hypertension: on HCTZ 25mg  and lisinopril 5mg . Now hypertensive off medications. Will advanced treatment with a goal of slowly reducing blood pressure especially in light of reperfusion bleed.  -Restart HCTZ 25mg   Alzheimers dementia: Advanced disease now with stroke worsening her disability. Has good strength and continent  and verbal but cannot follow commands or perform ADLs independently. Family is very reasonable with their expectations and want to minimize her suffering or futile medical treatments in the future. Namenda and aricept may worsen delirium in the acute setting -Hold namenda and aricept -Decrease ativan PRN -Consult to palliative care service for goals of care planning  FULL CODE Diet: Regular VTE ppx: SCDs  Dispo: Final disposition for rehab and plan of care needs to be arranged. Anticipated discharge in approximately 0-1 day(s).   The  patient does have a current PCP Elinor Dodge, MD)  The patient does have transportation limitations that hinder transportation to clinic appointments.  LOS: 2 days   Fuller Plan, MD 08/30/2015, 11:24 AM

## 2015-08-30 NOTE — Progress Notes (Signed)
Speech Language Pathology Treatment: Dysphagia  Patient Details Name: Glenda Coleman MRN: [id number] DOB: [birth year] Today's Date: 08/30/2015 Time: 8421-0312 SLP Time Calculation (min) (ACUTE ONLY): 19 min  Assessment / Plan / Recommendation Clinical Impression  Dysphagia treatment provided for diet check. Pt showed no s/s of aspiration with thin liquid or regular consistency textures. Pt did need minimal-moderate verbal/ visual cues to maintain attention to POs. NT reported that pt had not demonstrated difficulties with POs at lunch. Given cognitive status, aspiration risk is mild-moderate but risk reduced given full supervision and decreasing distractions to increase attention to meal. Recommend continuing regular diet, thin liquids, meds whole in puree or crushed if unsuccessful. Will sign off for swallowing but continue to follow briefly for family education of cognitive-linguistic strategies.   HPI HPI: Pt is a 70 y.o. female with PMH of Alzheimer's disease and hypertension who presented on 6/16 for flaccid right side. MRI 6/17 showed acute L CVA affecting temporal/ occipital lobes and insula/ slight involvement of L frontal operculum with reperfusion hemorrhage. According to SNF, at baseline pt is nonverbal and ambulatory. Palliative consult 6/19. Speech/language eval ordered as part of stroke workup.       SLP Plan  All goals met     Recommendations  Diet recommendations: Regular;Thin liquid Liquids provided via: Cup;Straw Medication Administration: Whole meds with puree Supervision: Patient able to self feed;Full supervision/cueing for compensatory strategies (minimal assist with self feeding) Compensations: Slow rate;Small sips/bites;Minimize environmental distractions             Oral Care Recommendations: Oral care BID Follow up Recommendations: 24 hour supervision/assistance Plan: All goals met     GO                Kern Reap, MA, CCC-SLP 08/30/2015, 2:52  PM [phone]

## 2015-08-31 MED ORDER — RISPERIDONE 0.5 MG PO TABS
1.0000 mg | ORAL_TABLET | Freq: Every day | ORAL | Status: DC
  Administered 2015-08-31: 1 mg via ORAL
  Filled 2015-08-31 (×2): qty 2

## 2015-08-31 MED ORDER — LISINOPRIL 5 MG PO TABS
5.0000 mg | ORAL_TABLET | Freq: Every day | ORAL | Status: DC
  Administered 2015-08-31 – 2015-09-02 (×3): 5 mg via ORAL
  Filled 2015-08-31 (×3): qty 1

## 2015-08-31 MED ORDER — RISPERIDONE 0.5 MG PO TABS
0.5000 mg | ORAL_TABLET | Freq: Every day | ORAL | Status: DC
  Administered 2015-09-01 – 2015-09-02 (×2): 0.5 mg via ORAL
  Filled 2015-08-31 (×2): qty 1

## 2015-08-31 NOTE — Progress Notes (Signed)
CSW is continuing to follow for SNF placement- [facility] when medically stable.  Per MD pt was requiring sitter this am due to agitation overnight- pt must be without sitter for 24 hours prior to DC- MD to discontinue sitter and [facility]  Merlyn LotJenna Holoman, [facility] Endoscopy CenterCSWA Clinical Social Worker [phone]

## 2015-08-31 NOTE — Progress Notes (Signed)
  Subjective: She required haldol last night for agitation. This morning she is in no acute distress but is aggressive and not responsive to questions.   Objective: Vital signs in last 24 hours: Filed Vitals:   08/30/15 1714 08/30/15 2207 08/31/15 0139 08/31/15 0541  BP: 166/100 189/83 155/75 161/84  Pulse: 97 69 66 66  Temp: 97.7 F (36.5 C) 98.2 F (36.8 C)  98.1 F (36.7 C)  TempSrc: Axillary Axillary  Axillary  Resp: 20 18 18 18   Weight:      SpO2: 99% 100% 97% 99%   Physical Exam General: In no apparent distress. Sitting in chair. CV: RRR. Normal S1/S2. No m/r/g. Pulm: CTAB. Abdomen: Soft, NT/ND. Positive bowel sounds. Ext: No peripheral edema. Neuro: Right-sided hemineglect. Leftward gaze. Normal motor function bilaterally. Not following commands.  Medications: I have reviewed the patient's current medications. Scheduled Meds: . aspirin EC  81 mg Oral Daily  . hydrochlorothiazide  25 mg Oral Daily  . lisinopril  5 mg Oral Daily  . sertraline  100 mg Oral Daily  . simvastatin  40 mg Oral q1800   Continuous Infusions:  PRN Meds:.risperiDONE Assessment/Plan: Active Problems:   CVA (cerebral vascular accident) (HCC)   Stroke (cerebrum) (HCC)   Dysphagia, pharyngoesophageal phase   Essential hypertension   HLD (hyperlipidemia)   Dementia   Acute CVA (cerebrovascular accident) (HCC)   DNR (do not resuscitate)   Palliative care encounter   Goals of care, counseling/discussion  Glenda Coleman is a 70 y/o woman with Alzheimer's disease and hypertension presenting for stroke.  Stroke: MRI shows L MCA infarction with hemorrhagic transformation, while CT revealed prior R mid-frontal lobe infarct. Echo had no evidence of cardiac embolie, and venous doppler was negative for DVT/SVT. Likely embolic stroke. Her baseline is ambulatory and nonresponsive to questions. She now requires assistance for ambulation and still needs help with all ADLs. - ASA restarted as no further  evidence of intracranial hemorrhage. - Telemetry - Waiting on SNF placement.  Alzhemier's: She requires assistance for all ADLs and for ambulation. - Holding donepezil, memantine, and sertraline - Risperidone PRN for agitation  HTN: Allowing for HTN to systolic 160 in setting of ischemic stroke. - HCTZ 25 mg  Dyslipidemia: - Zocar 40 mg   This is a Psychologist, occupationalMedical Student Note.  The care of the patient was discussed with Dr. Sheliah Hatchhristopher Rice and the assessment and plan formulated with their assistance.  Please see their attached note for official documentation of the daily encounter.   LOS: 3 days   Lissa MoralesMichaela B Erica Osuna, Med Student 08/31/2015, 8:48 AM

## 2015-08-31 NOTE — Progress Notes (Addendum)
Subjective: Patient did become agitated overnight and required haldol for sedation x1 since she was refusing oral therapies. This morning she was more alert and cooperative with examiners. Her brother discussed advanced care planning with palliative medicine service yesterday.  Objective: Vital signs in last 24 hours: Filed Vitals:   08/30/15 2207 08/31/15 0139 08/31/15 0541 08/31/15 1010  BP: 189/83 155/75 161/84 119/94  Pulse: 69 66 66 77  Temp: 98.2 F (36.8 C)  98.1 F (36.7 C) 98.5 F (36.9 C)  TempSrc: Axillary  Axillary Oral  Resp: 18 18 18 18   Weight:      SpO2: 100% 97% 99% 100%   Weight change:   Intake/Output Summary (Last 24 hours) at 08/31/15 1053 Last data filed at 08/30/15 1230  Gross per 24 hour  Intake    240 ml  Output      0 ml  Net    240 ml     GENERAL- Calm, unable to follow direct commands, speech limited to words and short phrases CARDIAC- RRR, no murmurs, rubs or gallops. RESP- Anterior breath sounds clear NEURO- pt unable to follow commands for strength assessment but moving all 4 limbs spontaneously, inattentive to right sided stimuli EXTREMITIES- symmetric, no pedal edema SKIN- Warm, dry, No rash or lesion  Lab Results: Basic Metabolic Panel:  Recent Labs Lab 08/27/15 1010 08/27/15 1021  NA 139 141  K 4.4 4.3  CL 106 105  CO2 24  --   GLUCOSE 131* 132*  BUN 15 21*  CREATININE 1.08* 1.10*  CALCIUM 9.5  --    Liver Function Tests:  Recent Labs Lab 08/27/15 1010  AST 38  ALT 14  ALKPHOS 43  BILITOT 1.1  PROT 6.9  ALBUMIN 3.8   CBC:  Recent Labs Lab 08/27/15 1010 08/27/15 1021  WBC 5.5  --   NEUTROABS 3.5  --   HGB 11.7* 12.9  HCT 37.3 38.0  MCV 88.0  --   PLT 272  --    CBG:  Recent Labs Lab 08/27/15 1004  GLUCAP 131*   Hemoglobin A1C:  Recent Labs Lab 08/27/15 1010  HGBA1C 6.1*   Fasting Lipid Panel:  Recent Labs Lab 08/28/15 0535  CHOL 186  HDL 106  LDLCALC 67  TRIG 63  CHOLHDL 1.8    Medications: I have reviewed the patient's current medications. Scheduled Meds: . aspirin EC  81 mg Oral Daily  . hydrochlorothiazide  25 mg Oral Daily  . lisinopril  5 mg Oral Daily  . sertraline  100 mg Oral Daily  . simvastatin  40 mg Oral q1800   Continuous Infusions:   PRN Meds:.risperiDONE Assessment/Plan: Acute left MCA infarct: Neuro deficit of right sided neglect remains stable from yesterday. US negative for LE DVT. Still no clear cardioembolic source identified on workup to date. Neurologically she continues to need significant assistance with movement which is not at her baseline function. -Based on rehab performance may plan SNF rehab before return to her original facility, will consult social work -ASA 81mg   Alzheimers dementia: Holding namenda and aricept in acute hospitalization for increased delirium risk. She is having significant confusion and agitation particularly at night with her hospitalization. Medically her stroke workup is essentially complete and without progression of neurological deficits. -Palliative medicine recommendations greatly appreciated -Hold namenda and aricept -Avoid benzos where possible -Risperidone 0.25mg  qHS PRN -Zoloft 100mg   Hypertension: SBP remains in 160s-180s, HCTZ started yesterday so full effect not seen yet. Given small hemorrhaging conversion w  would aim for a SBP<160  By this time -Continue HCTZ 25mg  -Resume home lisinopril   DNR/DNI Diet: Regular VTE ppx: SCDs  Dispo: Final disposition for rehab and plan of care needs to be arranged. Anticipated discharge in approximately 1-2 day(s).   The patient does have a current PCP Elinor Dodge, MD)  The patient does have transportation limitations that hinder transportation to clinic appoin tments.  LOS: 3 days   Fuller Plan, MD 08/31/2015, 10:53 AM

## 2015-08-31 NOTE — Progress Notes (Signed)
Internal Medicine Attending:   I saw and examined the patient. I reviewed the resident's note and I agree with the resident's findings and plan as documented in the resident's note.  70 year old woman admitted with acute L MCA infarction with hemorrhagic conversion, stable on aspirin 81mg  and simvastatin. Moderate sundowning at night that is being managed with haloperidol when needed. This morning she is sitting in the chair, no complaints. Nurses are doing a good job with delirium precautions during the day. I talked with her brother today, we are awaiting bed offers for SNF transfer.

## 2015-08-31 NOTE — Progress Notes (Signed)
Physical Therapy Treatment Patient Details Name: Glenda Coleman MRN: [id number] DOB: [birth year] Today's Date: 08/31/2015    History of Present Illness 70 y/o woman with PMHx significant for hypertension and advanced alzheimers dementia presents to the ED with complete right sided weakness and hemineglect. MRI on 6/17 + for acute LEFT MCA territory infarction.     PT Comments    Pt tolerated ambulation with PT today however functional progression greatly limited by cognitive impairments from the alzheimers dementia. Pt with noted R sided neglect in addition inability to comprehend or follow commands. Pt unable to care for herself at this time. Recommend SNF upon d/c unless family can provide assist x 24 hours.  Follow Up Recommendations  SNF;Supervision/Assistance - 24 hour     Equipment Recommendations       Recommendations for Other Services       Precautions / Restrictions Precautions Precautions: Fall Restrictions Weight Bearing Restrictions: No    Mobility  Bed Mobility               General bed mobility comments: pt up in chair  Transfers Overall transfer level: Needs assistance   Transfers: Sit to/from Stand Sit to Stand: Mod assist         General transfer comment: max tactile and verbal cues to participate. pt perseverating on shoes and being cold. found pts shoes and gave pt a blanket and she then participated in standing/walking  Ambulation/Gait Ambulation/Gait assistance: Min assist;+2 physical assistance (3rd for chair follow) Ambulation Distance (Feet): 75 Feet Assistive device: 2 person hand held assist Gait Pattern/deviations: Step-through pattern;Decreased stride length;Narrow base of support Gait velocity: slow Gait velocity interpretation: Below normal speed for age/gender General Gait Details: pt kept saying "oh god, oh god" pt with noted R sided neglect, max tactile cues posteriorly to keep forward momentum, pt with minimal comprehension and  dpendent on PT to stimulate task   Stairs            Wheelchair Mobility    Modified Rankin (Stroke Patients Only) Modified Rankin (Stroke Patients Only) Pre-Morbid Rankin Score: No significant disability Modified Rankin: Moderately severe disability     Balance Overall balance assessment: Needs assistance Sitting-balance support: Feet supported;Bilateral upper extremity supported Sitting balance-Leahy Scale: Fair       Standing balance-Leahy Scale: Poor Standing balance comment: requires physical assist                    Cognition Arousal/Alertness: Awake/alert Behavior During Therapy: Flat affect Overall Cognitive Status: No family/caregiver present to determine baseline cognitive functioning                      Exercises      General Comments        Pertinent Vitals/Pain Pain Assessment: Faces Faces Pain Scale: No hurt    Home Living                      Prior Function            PT Goals (current goals can now be found in the care plan section) Progress towards PT goals: Progressing toward goals    Frequency  Min 3X/week    PT Plan Current plan remains appropriate    Co-evaluation             End of Session Equipment Utilized During Treatment: Gait belt Activity Tolerance: Patient tolerated treatment well Patient left: in chair;with call bell/phone within reach;with  chair alarm set;with nursing/sitter in room     Time: [phone] PT Time Calculation (min) (ACUTE ONLY): 22 min  Charges:  $Gait Training: 8-22 mins                    G Codes:      Marcene BrawnChadwell, Maurico Perrell Marie 08/31/2015, 10:19 AM   Lewis ShockAshly Tilton Marsalis, PT, DPT Pager #: [phone] Office #: [phone]

## 2015-09-01 MED ORDER — RISPERIDONE 1 MG PO TBDP
1.0000 mg | ORAL_TABLET | Freq: Once | ORAL | Status: DC | PRN
  Filled 2015-09-01: qty 1

## 2015-09-01 MED ORDER — HALOPERIDOL 1 MG PO TABS: 2.0000 mg | ORAL_TABLET | Freq: Once | ORAL | Status: DC | PRN

## 2015-09-01 MED ORDER — HALOPERIDOL LACTATE 5 MG/ML IJ SOLN: 2.0000 mg | Freq: Once | INTRAMUSCULAR | Status: DC | PRN

## 2015-09-01 MED ORDER — HALOPERIDOL LACTATE 5 MG/ML IJ SOLN
1.0000 mg | Freq: Once | INTRAMUSCULAR | Status: AC
  Administered 2015-09-01: 1 mg via INTRAMUSCULAR
  Filled 2015-09-01: qty 1

## 2015-09-01 NOTE — Progress Notes (Addendum)
Per attending MD, TELE and IV d/c'ed along with sitter at this time. VS to be Q shift.  Will increase rounding for safety concerns. Family aware of no sitter at this time and possibly needs family involvement during the evening hour to sit with patient. Brother at bedside understood. No other concerns noted. Will continue to monitor.   Sim BoastHavy, RN

## 2015-09-01 NOTE — Progress Notes (Signed)
Subjective: No acute events overnight. She is still not able to follow commands but did not have any violent or very agitated problems with staff.  Objective: Vital signs in last 24 hours: Filed Vitals:   08/31/15 2153 09/01/15 0121 09/01/15 0553 09/01/15 0923  BP: 174/86 128/62 106/60 129/57  Pulse: 60 64 72 75  Temp: 98.2 F (36.8 C) 97.7 F (36.5 C) 98.3 F (36.8 C) 97.6 F (36.4 C)  TempSrc: Oral Axillary Axillary Oral  Resp: 18 18 18 18   Weight:      SpO2: 100% 96% 99% 98%   Weight change:   Intake/Output Summary (Last 24 hours) at 09/01/15 1142 Last data filed at 09/01/15 0821  Gross per 24 hour  Intake    360 ml  Output      0 ml  Net    360 ml    GENERAL- Calm, unable to follow direct commands CARDIAC- RRR, no murmurs, rubs or gallops NEURO- pt unable to follow commands for strength assessment but moving all 4 limbs spontaneously, eyes tracking to activity in right visual field EXTREMITIES- symmetric, no pedal edema SKIN- Warm, dry, No rash or lesion  Lab Results: Basic Metabolic Panel:  Recent Labs Lab 08/27/15 1010 08/27/15 1021  NA 139 141  K 4.4 4.3  CL 106 105  CO2 24  --   GLUCOSE 131* 132*  BUN 15 21*  CREATININE 1.08* 1.10*  CALCIUM 9.5  --    Liver Function Tests:  Recent Labs Lab 08/27/15 1010  AST 38  ALT 14  ALKPHOS 43  BILITOT 1.1  PROT 6.9  ALBUMIN 3.8   CBC:  Recent Labs Lab 08/27/15 1010 08/27/15 1021  WBC 5.5  --   NEUTROABS 3.5  --   HGB 11.7* 12.9  HCT 37.3 38.0  MCV 88.0  --   PLT 272  --    CBG:  Recent Labs Lab 08/27/15 1004  GLUCAP 131*   Hemoglobin A1C:  Recent Labs Lab 08/27/15 1010  HGBA1C 6.1*   Fasting Lipid Panel:  Recent Labs Lab 08/28/15 0535  CHOL 186  HDL 106  LDLCALC 67  TRIG 63  CHOLHDL 1.8   Medications: I have reviewed the patient's current medications. Scheduled Meds: . aspirin EC  81 mg Oral Daily  . hydrochlorothiazide  25 mg Oral Daily  . lisinopril  5 mg Oral  Daily  . risperiDONE  0.5 mg Oral Daily  . risperiDONE  1 mg Oral QHS  . sertraline  100 mg Oral Daily  . simvastatin  40 mg Oral q1800   Continuous Infusions:   PRN Meds:. Assessment/Plan: Acute left MCA infarct: Seems to have somewhat improved attention to right sided stimuli. Very difficult to assess with any precision as she cannot follow commands for a neurological exam. -Continue to plan for SNF rehab plan -ASA 81mg   Alzheimers dementia: Holding namenda and aricept in acute hospitalization for increased delirium risk. Agitation improved overnight with risperidone. -Palliative medicine recommendations greatly appreciated -Hold namenda and aricept -Risperidone 0.5mg  morning 1mg  evening  Hypertension: SBP decreased to 120s today. Change seems larger than would be expected as response to resuming home medications. Normotensive should be fine now 5 days from initial CVA evaluation. -Continue HCTZ 25mg  -Continue lisinopril 5mg   DNR/DNI Diet: Regular VTE ppx: SCDs  Dispo: She will need to be safe off continuous supervision with a sitter with goal of discharge for SNF rehab.  The patient does have a current PCP Elinor Dodge(Fernando A Sanchez-Brugal, MD)  The patient does have transportation limitations that hinder transportation to clinic appointments.  LOS: 4 days   Fuller Plan, MD 09/01/2015, 11:42 AM

## 2015-09-01 NOTE — Progress Notes (Signed)
Internal Medicine Attending:   I saw and examined the patient. I reviewed the resident's note and I agree with the resident's findings and plan as documented in the resident's note.  Patient is doing better today. She is responding to me while I talk on her right side, which is much improved. I think her hemineglect is overall improving slowly. Sundowning continues to be a problem, but the standing risperidone yesterday may be helping. An extra dose can be given if nursing sees her starting to get agitated or confused. Please check at QT measurement tomorrow in anticipation of discharge soon. I think it is fine to minimize monitors, IVs, vitals checks, and labs to help her delirium.

## 2015-09-01 NOTE — Care Management Note (Signed)
Case Management Note  Patient Details  Name: Glenda Coleman MRN: [id number] Date of Birth: 1945/06/17  Subjective/Objective:  Glenda Coleman continues to make slow improvements, sitter was discontinued today [facility]. Agitation decreased possibly attributed to medication adjustment. (Risperidone Am/Pm)                   Action/Plan: Will continue to follow this case although disposition is deferred to CSW for SNF placement.    Expected Discharge Date:                  Expected Discharge Plan:  Skilled Nursing Facility  In-House Referral:  Clinical Social Work  Discharge planning Services  CM Consult  Post Acute Care Choice:    Choice offered to:     DME Arranged:    DME Agency:     HH Arranged:    HH Agency:     Status of Service:  In process, will continue to follow  If discussed at Long Length of Stay Meetings, dates discussed:    Additional Comments:  Yvone NeuCrutchfield, Glenda Varin M, RN 09/01/2015, 1:48 PM

## 2015-09-01 NOTE — Progress Notes (Signed)
Pt refused her dose of respidal tonight , agitated and restless, Dr Monica BectonV. Rathore (on call) paged and notified, ordered a dose of haldol 1mg  same given at 2301, pt reassured, will however continue to monitor. Obasogie-Asidi, Lucia Harm Efe

## 2015-09-02 ENCOUNTER — Other Ambulatory Visit: Payer: Self-pay

## 2015-09-02 DIAGNOSIS — G309 Alzheimer's disease, unspecified: Secondary | ICD-10-CM

## 2015-09-02 DIAGNOSIS — F0280 Dementia in other diseases classified elsewhere without behavioral disturbance: Secondary | ICD-10-CM

## 2015-09-02 MED ORDER — RISPERIDONE 1 MG PO TABS: 1.0000 mg | ORAL_TABLET | ORAL | Status: AC

## 2015-09-02 MED ORDER — ASPIRIN 81 MG PO TBEC: 81.0000 mg | DELAYED_RELEASE_TABLET | Freq: Every day | ORAL | Status: DC

## 2015-09-02 NOTE — Progress Notes (Signed)
Subjective: Patient continues to be disoriented and required one dose of IM haldol due to refusing her dose of risperidone. Afterwards was able to be redirected and slept a fair amount overnight. She did have an episode or urinary incontinence. She again attends at least partially to persons moving and speaking on her right side this morning.  Objective: Vital signs in last 24 hours: Filed Vitals:   09/01/15 0553 09/01/15 0923 09/01/15 2219 09/02/15 0915  BP: 106/60 129/57 131/60 107/55  Pulse: 72 75 79 74  Temp: 98.3 F (36.8 C) 97.6 F (36.4 C) 98 F (36.7 C) 98.3 F (36.8 C)  TempSrc: Axillary Oral Axillary Oral  Resp: 18 18 18 16   Weight:      SpO2: 99% 98% 99% 99%   Weight change:   Intake/Output Summary (Last 24 hours) at 09/02/15 1052 Last data filed at 09/01/15 2200  Gross per 24 hour  Intake    720 ml  Output      0 ml  Net    720 ml   GENERAL- Calm, unable to follow direct commands, sleeping but easily aroused and remains awake throughout interview CARDIAC- RRR, no murmurs, rubs or gallops NEURO- pt unable to follow commands for strength assessment but moving all 4 limbs spontaneously, upper extremity strength is grossly intact EXTREMITIES- symmetric, no pedal edema SKIN- Warm, dry, No rash or lesion  Lab Results: Basic Metabolic Panel:  Recent Labs Lab 08/27/15 1010 08/27/15 1021  NA 139 141  K 4.4 4.3  CL 106 105  CO2 24  --   GLUCOSE 131* 132*  BUN 15 21*  CREATININE 1.08* 1.10*  CALCIUM 9.5  --     CBG:  Fasting Lipid Panel:  Recent Labs Lab 08/28/15 0535  CHOL 186  HDL 106  LDLCALC 67  TRIG 63  CHOLHDL 1.8   Urinalysis:  Recent Labs Lab 08/27/15 1109  COLORURINE YELLOW  LABSPEC 1.019  PHURINE 7.0  GLUCOSEU NEGATIVE  HGBUR NEGATIVE  BILIRUBINUR NEGATIVE  KETONESUR NEGATIVE  PROTEINUR 30*  NITRITE NEGATIVE  LEUKOCYTESUR NEGATIVE   Medications: I have reviewed the patient's current medications. Scheduled Meds: . aspirin  EC  81 mg Oral Daily  . hydrochlorothiazide  25 mg Oral Daily  . lisinopril  5 mg Oral Daily  . risperiDONE  0.5 mg Oral Daily  . risperiDONE  1 mg Oral QHS  . sertraline  100 mg Oral Daily  . simvastatin  40 mg Oral q1800   Continuous Infusions:  PRN Meds:.risperiDONE Assessment/Plan: Acute left MCA infarct: Seems to have somewhat improved attention to right sided stimuli. This is encouraging regarding her stroke recovery and rehab. Neurological deficits are however very difficult to assess with any precision as she cannot follow commands. -Continue to plan for SNF rehab plan -ASA 81mg   Alzheimers dementia: Holding namenda and aricept in acute hospitalization for increased delirium risk. Overall she is doing much better with atypical antipsychotics for sedation than her initial short acting benzodiazepines. She received IM haldol overnight due to refusing PO medicine but it was a very minimal dose of 1mg . Our plan will be for discharge to SNF continuing these medications. Due to risk of QT prolongation with frequent dosing of these medications we will try to obtain an updated EKG to ensure her risk of cardiac arrhythmia is not being inappropriately prolonged. -Risperidone 0.5mg  morning 1mg  evening -Repeat EKG today -Minimize continuous monitoring, does not need continuous IV access  DNR/DNI Diet: Regular VTE ppx: SCDs  Dispo:  She will need to be safe off continuous supervision with a sitter with goal of discharge for SNF rehab.  The patient does have a current PCP Elinor Dodge(Fernando A Sanchez-Brugal, MD)  The patient does have transportation limitations that hinder transportation to clinic appointments.   LOS: 5 days   Fuller Planhristopher W Cordarius Benning, MD 09/02/2015, 10:52 AM

## 2015-09-02 NOTE — Care Management Note (Signed)
Case Management Note  Patient Details  Name: Devoria GlassingMary Credeur MRN: [id number] Date of Birth: 01-16-1946  Subjective/Objective:                    Action/Plan:   Expected Discharge Date:                  Expected Discharge Plan:  Skilled Nursing Facility  In-House Referral:  Clinical Social Work  Discharge planning Services  CM Consult  Post Acute Care Choice:    Choice offered to:     DME Arranged:    DME Agency:     HH Arranged:    HH Agency:     Status of Service:  In process, will continue to follow  If discussed at Long Length of Stay Meetings, dates discussed:  09/02/2015  Additional Comments:  Yvone Neurutchfield, Jamye Balicki M, RN 09/02/2015, 9:05 AM

## 2015-09-02 NOTE — Progress Notes (Signed)
[facility]  Anticipated DC date: 09/02/15 Family notified: Jerilynn Somalvin Transport by: Sharin MonsPTAR   Per MD [facility]. RN, patient, patient's family, and facility notified of DC. RN given number for report. DC packet on chart. Ambulance transport requested for patient.   CSW signing off.  Cristobal GoldmannNadia Abrish Erny, [place] Clinical Social Worker [phone]

## 2015-09-02 NOTE — Progress Notes (Signed)
Internal Medicine Attending:   I saw and examined the patient. I reviewed the resident's note and I agree with the resident's findings and plan as documented in the resident's note.  Sitting up in the bed this morning, asleep and calm. No significant agitation yesterday or last night. Doing well on risperidone scheduled. Plan to check QT interval today so we can recommend an outpatient dose. I think she is stable for transfer to SNF at this time, and we are awaiting placement.

## 2015-09-02 NOTE — Discharge Summary (Signed)
Name: Glenda Coleman MRN: [id number] DOB: 06/18/1945 70 y.o. PCP: Elinor Dodge, MD  Date of Admission: 08/27/2015  9:59 AM Date of Discharge: 09/02/2015 Attending Physician: Glenda Alias, MD  Discharge Diagnosis: Active Problems:   CVA (cerebral vascular accident) [facility])   Stroke (cerebrum) (HCC)   Dysphagia, pharyngoesophageal phase   Essential hypertension   HLD (hyperlipidemia)   Dementia   Acute CVA (cerebrovascular accident) (HCC)   DNR (do not resuscitate)   Palliative care encounter   Goals of care, counseling/discussion  Discharge Medications:   Medication List    STOP taking these medications        ALPRAZolam 0.25 MG tablet  Commonly known as:  XANAX     HYDROcodone-acetaminophen 5-325 MG tablet  Commonly known as:  NORCO/VICODIN      TAKE these medications        acetaminophen 500 MG tablet  Commonly known as:  TYLENOL  Take 500 mg by mouth every 6 (six) hours as needed for moderate pain.     amoxicillin 500 MG capsule  Commonly known as:  AMOXIL  Take 2,000 mg by mouth See admin instructions. Take 4 capsules prior to dental appts     aspirin 81 MG EC tablet  Take 1 tablet (81 mg total) by mouth daily.     Biotin 5 MG Tabs  Take 5 mg by mouth daily.     calcium-vitamin D 500-200 MG-UNIT tablet  Commonly known as:  OSCAL WITH D  Take 1 tablet by mouth 2 (two) times daily.     DAILY VITE PO  Take 1 tablet by mouth daily.     donepezil 10 MG tablet  Commonly known as:  ARICEPT  Take 20 mg by mouth daily.     Fish Oil 1000 MG Caps  Take 2,000 mg by mouth daily.     hydrochlorothiazide 25 MG tablet  Commonly known as:  HYDRODIURIL  Take 25 mg by mouth daily.     lisinopril 5 MG tablet  Commonly known as:  PRINIVIL,ZESTRIL  Take 5 mg by mouth daily.     memantine 10 MG tablet  Commonly known as:  NAMENDA  Take 10 mg by mouth daily.     naproxen 500 MG tablet  Commonly known as:  NAPROSYN  Take 500 mg by mouth 2  (two) times daily with a meal.     risperiDONE 1 MG tablet  Commonly known as:  RISPERDAL  Take 1 tablet (1 mg total) by mouth as directed. Take 0.5mg  qAM and 1mg  qHS     sertraline 100 MG tablet  Commonly known as:  ZOLOFT  Take 100 mg by mouth daily.     simvastatin 40 MG tablet  Commonly known as:  ZOCOR  Take 40 mg by mouth daily.        Disposition and follow-up:   [name] was discharged from [facility] in Good condition.  At the hospital follow up visit please address:  1.  Advanced alzheimer's dementia: Patient had fair amount of sundowning agitation during hospitalization. Xanax TID was discontinued as benzodiazepines may exacerbate her delirium. She was started on Risperdal 0.5mg  qAM and 1mg  qPM with good response but caution for mild prolongation of corrected QT interval to 488 and would avoid increasing this dose if possible and avoid starting other QT prolonging treatments while on this treatment.  2.  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 There was reperfusion hemorrhage present but imaging not repeated due to improving neurological symptoms reassuring there was no expanding hemorrhage. Needs follow up in [facility] as well as Neurology clinic where she may be appropriate to start 30 day event monitoring to assess for paroxysmal atrial fibrillation.  Follow-up Appointments: Follow-up Information    Follow up with Elinor Dodge, MD. Call in 1 week.   Specialty:  Internal Medicine   Why:  For follow up when improving from SNF rehab course   Contact information:   [address] [place] [address] [phone]       Discharge Instructions:  Consultations: Palliative Care  Procedures Performed:  Ct Head Wo Contrast  08/27/2015  CLINICAL DATA:  Right-sided weakness.  Alzheimer's dementia. EXAM: CT HEAD WITHOUT CONTRAST TECHNIQUE:  Contiguous axial images were obtained from the base of the skull through the vertex without intravenous contrast. COMPARISON:  October 06, 2014 FINDINGS: Moderate diffuse atrophy is stable. There is no intracranial mass, hemorrhage, extra-axial fluid collection, or midline shift. There is widespread small vessel disease in the centra semiovale bilaterally. There is a stable prior infarct mid right frontal lobe. No acute infarct is evident. The bony calvarium appears intact. The mastoid air cells are clear. There is opacification of most of the right maxillary antrum with mild expansion of the right maxillary antrum medially. There is a retention cyst in the inferior left maxillary antrum. There is mucosal thickening in several ethmoid air cells bilaterally. No intraorbital lesions are evident. IMPRESSION: Atrophy with extensive periventricular small vessel disease. Prior infarct mid right frontal lobe. No acute infarct evident. No hemorrhage or mass effect. There is paranasal sinus disease at several sites. Question mucocele right maxillary sinus region, given mild medial expansion of the right maxillary antrum. Electronically Signed   By: Bretta Bang III M.D.   On: 08/27/2015 10:44   Mr Maxine Glenn Head Wo Contrast  08/28/2015  CLINICAL DATA:  RIGHT-sided weakness. History of dementia. History of hypertension and previous stroke. EXAM: MRI HEAD WITHOUT AND WITH CONTRAST AND MRA HEAD WITHOUT AND WITH CONTRAST AND MRI NECK WITHOUT AND WITH CONTRAST TECHNIQUE: Multiplanar, multiecho pulse sequences of the brain and surrounding structures were obtained without and with intravenous contrast. Angiographic images of the head were obtained using MRA technique without and with contrast. Multiplanar, multiecho pulse sequences of the neck and surrounding structures were obtained without and with intravenous contrast. CONTRAST:  16mL MULTIHANCE GADOBENATE DIMEGLUMINE 529 MG/ML IV SOLN COMPARISON:  CT head 08/27/2015. FINDINGS:  The patient was unable to remain motionless for the exam. Small or subtle lesions could be overlooked. MRI HEAD FINDINGS Confluent area of restricted diffusion in the LEFT temporal lobe, LEFT occipital lobe, and insula, slight involvement of LEFT frontal operculum, representing acute LEFT MCA territory infarction. Gyriform type susceptibility pattern on gradient sequence is consistent with acute hemorrhage, not present on yesterday's CT. No midline shift. Global atrophy with hydrocephalus ex vacuo. Scattered small lacunes and small areas of cerebellar infarction. Extensive focal and confluent T2 and FLAIR hyperintensities throughout the white matter, likely chronic microvascular ischemic change. Pituitary and cerebellar tonsils unremarkable. Cervical spondylosis. Chronic sinusitis, most notable in the RIGHT maxillary region. Post infusion, there is may be slight parenchymal enhancement in the area of acute infarction. Slight intravascular enhancement is also observed in the affected vascular territory. Major dural venous sinuses are patent. MRA HEAD FINDINGS Motion degraded exam. Subtle areas of stenosis or thrombus could be overlooked. Grossly patent  internal carotid arteries and basilar artery. Vertebrals patent, RIGHT dominant. No proximal large vessel occlusion. Irregularity of the BILATERAL M3 segment MCA vessels, but no proximal MCA bifurcation or trifurcation occlusion on the LEFT. Suspected infundibulum LEFT PCOM. Cerebellar branches poorly visualized due to motion degradation. Signal dropout of the ambient segment posterior cerebral arteries due to motion and in-plane flow. MRI NECK FINDINGS Conventional branching of the great vessels from the arch. No proximal great vessel narrowing. Suspected 50% stenosis at the origin of the RIGHT ICA. No similar stenosis on the LEFT. No carotid dissection. BILATERAL vertebral artery patency is established, with ostial narrowing at the smaller LEFT vertebral origin.  IMPRESSION: Acute LEFT MCA territory infarction, posterior M2 division, with reperfusion hemorrhage. Embolic stroke is suspected. Compared with yesterday's CT, no hemorrhage was apparent and the infarct itself was not visible. Atrophy and small vessel disease. No extracranial or proximal intracranial vascular occlusion or flow reducing stenosis. These results were called by telephone at the time of interpretation on 08/28/2015 at 11:00 am to Dr. Hyman Hopes Samel Bruna, who verbally acknowledged these results. Electronically Signed   By: Elsie StainJohn T Curnes M.D.   On: 08/28/2015 11:01   Mr Angiogram Neck W Wo Contrast  08/28/2015  CLINICAL DATA:  RIGHT-sided weakness. History of dementia. History of hypertension and previous stroke. EXAM: MRI HEAD WITHOUT AND WITH CONTRAST AND MRA HEAD WITHOUT AND WITH CONTRAST AND MRI NECK WITHOUT AND WITH CONTRAST TECHNIQUE: Multiplanar, multiecho pulse sequences of the brain and surrounding structures were obtained without and with intravenous contrast. Angiographic images of the head were obtained using MRA technique without and with contrast. Multiplanar, multiecho pulse sequences of the neck and surrounding structures were obtained without and with intravenous contrast. CONTRAST:  16mL MULTIHANCE GADOBENATE DIMEGLUMINE 529 MG/ML IV SOLN COMPARISON:  CT head 08/27/2015. FINDINGS: The patient was unable to remain motionless for the exam. Small or subtle lesions could be overlooked. MRI HEAD FINDINGS Confluent area of restricted diffusion in the LEFT temporal lobe, LEFT occipital lobe, and insula, slight involvement of LEFT frontal operculum, representing acute LEFT MCA territory infarction. Gyriform type susceptibility pattern on gradient sequence is consistent with acute hemorrhage, not present on yesterday's CT. No midline shift. Global atrophy with hydrocephalus ex vacuo. Scattered small lacunes and small areas of cerebellar infarction. Extensive focal and confluent T2 and FLAIR hyperintensities  throughout the white matter, likely chronic microvascular ischemic change. Pituitary and cerebellar tonsils unremarkable. Cervical spondylosis. Chronic sinusitis, most notable in the RIGHT maxillary region. Post infusion, there is may be slight parenchymal enhancement in the area of acute infarction. Slight intravascular enhancement is also observed in the affected vascular territory. Major dural venous sinuses are patent. MRA HEAD FINDINGS Motion degraded exam. Subtle areas of stenosis or thrombus could be overlooked. Grossly patent internal carotid arteries and basilar artery. Vertebrals patent, RIGHT dominant. No proximal large vessel occlusion. Irregularity of the BILATERAL M3 segment MCA vessels, but no proximal MCA bifurcation or trifurcation occlusion on the LEFT. Suspected infundibulum LEFT PCOM. Cerebellar branches poorly visualized due to motion degradation. Signal dropout of the ambient segment posterior cerebral arteries due to motion and in-plane flow. MRI NECK FINDINGS Conventional branching of the great vessels from the arch. No proximal great vessel narrowing. Suspected 50% stenosis at the origin of the RIGHT ICA. No similar stenosis on the LEFT. No carotid dissection. BILATERAL vertebral artery patency is established, with ostial narrowing at the smaller LEFT vertebral origin. IMPRESSION: Acute LEFT MCA territory infarction, posterior M2 division, with reperfusion hemorrhage. Embolic  stroke is suspected. Compared with yesterday's CT, no hemorrhage was apparent and the infarct itself was not visible. Atrophy and small vessel disease. No extracranial or proximal intracranial vascular occlusion or flow reducing stenosis. These results were called by telephone at the time of interpretation on 08/28/2015 at 11:00 am to Dr. Hyman Hopes, who verbally acknowledged these results. Electronically Signed   By: Elsie Stain M.D.   On: 08/28/2015 11:01   Mr Laqueta Jean ZO Contrast  08/28/2015  CLINICAL DATA:   RIGHT-sided weakness. History of dementia. History of hypertension and previous stroke. EXAM: MRI HEAD WITHOUT AND WITH CONTRAST AND MRA HEAD WITHOUT AND WITH CONTRAST AND MRI NECK WITHOUT AND WITH CONTRAST TECHNIQUE: Multiplanar, multiecho pulse sequences of the brain and surrounding structures were obtained without and with intravenous contrast. Angiographic images of the head were obtained using MRA technique without and with contrast. Multiplanar, multiecho pulse sequences of the neck and surrounding structures were obtained without and with intravenous contrast. CONTRAST:  16mL MULTIHANCE GADOBENATE DIMEGLUMINE 529 MG/ML IV SOLN COMPARISON:  CT head 08/27/2015. FINDINGS: The patient was unable to remain motionless for the exam. Small or subtle lesions could be overlooked. MRI HEAD FINDINGS Confluent area of restricted diffusion in the LEFT temporal lobe, LEFT occipital lobe, and insula, slight involvement of LEFT frontal operculum, representing acute LEFT MCA territory infarction. Gyriform type susceptibility pattern on gradient sequence is consistent with acute hemorrhage, not present on yesterday's CT. No midline shift. Global atrophy with hydrocephalus ex vacuo. Scattered small lacunes and small areas of cerebellar infarction. Extensive focal and confluent T2 and FLAIR hyperintensities throughout the white matter, likely chronic microvascular ischemic change. Pituitary and cerebellar tonsils unremarkable. Cervical spondylosis. Chronic sinusitis, most notable in the RIGHT maxillary region. Post infusion, there is may be slight parenchymal enhancement in the area of acute infarction. Slight intravascular enhancement is also observed in the affected vascular territory. Major dural venous sinuses are patent. MRA HEAD FINDINGS Motion degraded exam. Subtle areas of stenosis or thrombus could be overlooked. Grossly patent internal carotid arteries and basilar artery. Vertebrals patent, RIGHT dominant. No proximal  large vessel occlusion. Irregularity of the BILATERAL M3 segment MCA vessels, but no proximal MCA bifurcation or trifurcation occlusion on the LEFT. Suspected infundibulum LEFT PCOM. Cerebellar branches poorly visualized due to motion degradation. Signal dropout of the ambient segment posterior cerebral arteries due to motion and in-plane flow. MRI NECK FINDINGS Conventional branching of the great vessels from the arch. No proximal great vessel narrowing. Suspected 50% stenosis at the origin of the RIGHT ICA. No similar stenosis on the LEFT. No carotid dissection. BILATERAL vertebral artery patency is established, with ostial narrowing at the smaller LEFT vertebral origin. IMPRESSION: Acute LEFT MCA territory infarction, posterior M2 division, with reperfusion hemorrhage. Embolic stroke is suspected. Compared with yesterday's CT, no hemorrhage was apparent and the infarct itself was not visible. Atrophy and small vessel disease. No extracranial or proximal intracranial vascular occlusion or flow reducing stenosis. These results were called by telephone at the time of interpretation on 08/28/2015 at 11:00 am to Dr. Hyman Hopes, who verbally acknowledged these results. Electronically Signed   By: Elsie Stain M.D.   On: 08/28/2015 11:01    2D Echo: LV EF: 55% - 60% ------------------------------------------------------------------- Indications: CVA 436. ------------------------------------------------------------------- History: Risk factors: Alzheimer&'s dementia. Hypertension. ------------------------------------------------------------------- Study Conclusions  - Left ventricle: The cavity size was normal. Wall thickness was  increased in a pattern of mild LVH. Systolic function was normal.  The estimated ejection fraction  was in the range of 55% to 60%.  Wall motion was normal; there were no regional wall motion  abnormalities. Doppler parameters are consistent with abnormal  left  ventricular relaxation (grade 1 diastolic dysfunction). - Mitral valve: There was mild to moderate regurgitation.  Transthoracic echocardiography. M-mode, complete 2D, spectral Doppler, and color Doppler. Birthdate: Patient birthdate: 1945/06/06. Age: Patient is 70 yr old. Sex: Gender: female. Blood pressure: 189/88 Patient status: Inpatient. Study date: Study date: 08/28/2015. Study time: 03:04 PM. Location: Bedside. ------------------------------------------------------------------- Left ventricle: The cavity size was normal. Wall thickness was increased in a pattern of mild LVH. Systolic function was normal. The estimated ejection fraction was in the range of 55% to 60%. Wall motion was normal; there were no regional wall motion abnormalities. Doppler parameters are consistent with abnormal left ventricular relaxation (grade 1 diastolic dysfunction). ------------------------------------------------------------------- Aortic valve: Trileaflet; normal thickness leaflets. Mobility was not restricted. Doppler: Transvalvular velocity was within the normal range. There was no stenosis. There was no regurgitation. ------------------------------------------------------------------- Aorta: Aortic root: The aortic root was normal in size. ------------------------------------------------------------------- Mitral valve: Structurally normal valve. Mobility was not restricted. Doppler: Transvalvular velocity was within the normal range. There was no evidence for stenosis. There was mild to moderate regurgitation. ------------------------------------------------------------------- Left atrium: The atrium was normal in size. ------------------------------------------------------------------- Right ventricle: The cavity size was normal. Wall thickness was normal. Systolic function was  normal. ------------------------------------------------------------------- Pulmonic valve: Not well visualized. Doppler: Transvalvular velocity was within the normal range. There was no evidence for stenosis. ------------------------------------------------------------------- Tricuspid valve: Structurally normal valve. Doppler: Transvalvular velocity was within the normal range. There was mild regurgitation. ------------------------------------------------------------------- Pulmonary artery: The main pulmonary artery was normal-sized. ------------------------------------------------------------------- Right atrium: The atrium was normal in size. ------------------------------------------------------------------- Pericardium: There was no pericardial effusion. ------------------------------------------------------------------- Systemic veins: Inferior vena cava: Not well visualized. ------------------------------------------------------------------- Measurements  Left ventricle Value Reference LV ID, ED, PLAX chordal (L) 40.5 mm 43 - 52 LV ID, ES, PLAX chordal 30.5 mm 23 - 38 LV fx shortening, PLAX chordal (L) 25 % >=29 LV PW thickness, ED 13 mm --------- IVS/LV PW ratio, ED 0.84 <=1.3 LV e&', lateral 6.09 cm/s --------- LV E/e&', lateral 9.87 --------- LV e&', medial 5.11 cm/s --------- LV E/e&', medial 11.76 --------- LV e&', average 5.6 cm/s --------- LV E/e&', average 10.73 ---------  Ventricular septum Value Reference IVS thickness, ED 10.9 mm ---------  LVOT Value  Reference LVOT ID, S 19 mm --------- LVOT area 2.84 cm^2 ---------  Left atrium Value Reference LA ID, A-P, ES 31 mm --------- LA volume, S 38.6 ml --------- LA volume, ES, 1-p A4C 28.6 ml --------- LA volume, ES, 1-p A2C 44.7 ml ---------  Mitral valve Value Reference Mitral E-wave peak velocity 60.1 cm/s --------- Mitral A-wave peak velocity 94.6 cm/s --------- Mitral deceleration time 218 ms 150 - 230 Mitral E/A ratio, peak 0.6 ---------  Systemic veins Value Reference Estimated CVP 3 mm Hg ---------  Legend: (L) and (H) mark values outside specified reference range.   Admission HPI: 70 y/o woman with PMHx significant for hypertension and advanced alzheimers dementia presents to the ED from her senior nursing facility after waking up this morning with complete right sided weakness and hemineglect. She was last seen to be at her baseline status around 5pm yesterday evening. No preceding history of illness or new complaints. EMS was called and on responding also observed several episodes of nonbilious nonbloody vomiting. Since arrival to the ED she has been confused and only intermittently verbal. It is unclear how different  this is than her reported baseline of ambulatory but mostly nonverbal. Her right sided strength was found to be normal in reflexive movements but not used in purposeful manner or on commands. CT head was obtained showing no acute infarct or bleed but old right frontal infarct. She did have active vomiting in the ED and was given zofran with improvement. Other initial laboratory tests unremarkable  and mildly hypertensive.  Hospital Course by problem list: Acute left parietal stroke: Patient was admitted with significant right sided neglect and poor mental status. Initial head CT showed old right frontal infarct without acute bleeding and she was initially given ASA 325mg . MRI delayed until hospital day 1 due to patient agitation and unable to follow commands.  MRI demonstrated a large L MCA distribution infarct with small blood consistent with reperfusion hemorrhage. Due to her relatively small bleed and no progressive neurological symptoms she was continued on ASA 81mg  and no pharmacologic VTE ppx. BLE doppler US was negative for DVT. TTE obtained did not show any clear cause for embolus. Lipid panel was very good with a HDL of 106 and total cholesterol of 186, and statin therapy was continued.  She initially failed a bedside swallowing evaluation but this improved by day 2 and she was tolerating a regular diet. She also had continued right sided hemineglect that improved throughout her stay acknowledging at least some stimuli by day 4. She participated moderately well with PT, limited by her dementia. She continued to have gait instability recommending a more intensive rehab regimen in a SNF setting.  Hypertension: She was hypertensive to 160s-180s from withholding her home antihypertensives due to new CVA. This improved to normal ranges with SBP 120s with resuming home antihypertensives HCTZ 25mg  and lisinopril 5mg  on day 4-5 after her acute CVA. Metabolic panel remained WNL.  Alzheimers dementia: She was admitted as nonverbal with inability to consistently follow direct instructions. Her xanax and hydrocodone were held due to no active complaints of pain and concern of worsening her sundowning and disorientation. She responded very well to PO rispderidone which was titrated p to 0.5mg  qAM and 1mg  qPM allowing discontinuing of soft mechanical restraint or continuous sitter at bedside. IM haldol  was administered twice (2mg , 1mg ) due to agitation overnight that caused her to refuse oral medications. Once on risperidone a repeat EKG was checked showing a mild prolongation of the corrected QT interval from 471 to 488 on this treatment.  Goals of care counseling: Patient case was discussed at length with her brother and son. Palliative medicine service was consulted and at famiyl discussion completed a MOST form to expressly define their wishes for advanced care including DNR/DNI, does not want feeding tube or TPN.  Discharge Vitals:   BP 121/71 mmHg  Pulse 77  Temp(Src) 98.7 F (37.1 C) (Axillary)  Resp 16  Wt 74.844 kg (165 lb)  SpO2 98%  Discharge Labs:  No results found for this or any previous visit (from the past 24 hour(s)).  Signed: Fuller Plan, MD 09/02/2015, 3:32 PM   Services Ordered on Discharge: SNF rehab

## 2015-09-02 NOTE — Progress Notes (Signed)
Physical Therapy Treatment Patient Details Name: Glenda GlassingMary Coleman MRN: [id number] DOB: 07-03-1945 Today's Date: 09/02/2015    History of Present Illness 70 y/o woman with PMHx significant for hypertension and advanced alzheimers dementia presents to the ED with complete right sided weakness and hemineglect. MRI on 6/17 + for acute LEFT MCA territory infarction.     PT Comments    Patient required max A +2 for safety of transfers and demonstrated decreased initiation of all tasks. Demonstrated L side preference throughout session. Current plan remains appropriate.   Follow Up Recommendations  SNF;Supervision/Assistance - 24 hour     Equipment Recommendations  Other (comment)    Recommendations for Other Services       Precautions / Restrictions Precautions Precautions: Fall Restrictions Weight Bearing Restrictions: No    Mobility  Bed Mobility Overal bed mobility: Needs Assistance Bed Mobility: Supine to Sit;Sit to Supine     Supine to sit: Max assist;+2 for physical assistance Sit to supine: Max assist;+2 for physical assistance   General bed mobility comments: Max cues for initiation and sequencing; max A due to decreased initiation   Transfers Overall transfer level: Needs assistance Equipment used: 2 person hand held assist Transfers: Sit to/from [employer]Stand;Stand Pivot Transfers Sit to Stand: Max assist;+2 physical assistance Stand pivot transfers: Max assist;+2 physical assistance       General transfer comment: max A +2 for initiation of tasks and for sequencing with therapist giving multimodal cues for pivoting feet  Ambulation/Gait             General Gait Details: not assessed this session   Stairs            Wheelchair Mobility    Modified Rankin (Stroke Patients Only) Modified Rankin (Stroke Patients Only) Pre-Morbid Rankin Score: No significant disability Modified Rankin: Moderately severe disability     Balance Overall balance  assessment: Needs assistance Sitting-balance support: Feet supported;No upper extremity supported Sitting balance-Leahy Scale: Fair     Standing balance support: Bilateral upper extremity supported;During functional activity Standing balance-Leahy Scale: Poor                      Cognition Arousal/Alertness: Awake/alert Behavior During Therapy: Flat affect Overall Cognitive Status: Impaired/Different from baseline Area of Impairment: Memory;Following commands;Safety/judgement     Memory: Decreased short-term memory Following Commands: Follows one step commands inconsistently Safety/Judgement: Decreased awareness of deficits;Decreased awareness of safety     General Comments: pt has h/o of advanced Alzheimers dementia    Exercises      General Comments General comments (skin integrity, edema, etc.): pt demonstrated decreased use of R UE  for tasks most of session       Pertinent Vitals/Pain Pain Assessment: Faces Faces Pain Scale: No hurt    Home Living                      Prior Function            PT Goals (current goals can now be found in the care plan section) Acute Rehab PT Goals Patient Stated Goal: none stated Progress towards PT goals: Progressing toward goals    Frequency  Min 3X/week    PT Plan Current plan remains appropriate    Co-evaluation PT/OT/SLP Co-Evaluation/Treatment: Yes Reason for Co-Treatment: Complexity of the patient's impairments (multi-system involvement);For patient/therapist safety PT goals addressed during session: Mobility/safety with mobility OT goals addressed during session: ADL's and self-care;Other (comment) (functional mobility)  End of Session Equipment Utilized During Treatment: Gait belt Activity Tolerance: Patient tolerated treatment well Patient left: in bed;with call bell/phone within reach;with bed alarm set     Time: 1152-1226 PT Time Calculation (min) (ACUTE ONLY): 34 min  Charges:   $Therapeutic Activity: 8-22 mins                    G Codes:      Derek MoundKellyn R Ladoris Lythgoe Navon Kotowski, PTA Pager: [phone]   09/02/2015, 4:05 PM

## 2015-09-02 NOTE — Clinical Social Work Placement (Signed)
   CLINICAL SOCIAL WORK PLACEMENT  NOTE  Date:  09/02/2015  Patient Details  Name: Glenda Coleman MRN: [id number] Date of Birth: [birth year]  Clinical Social Work is seeking post-discharge placement for this patient at the Skilled  Nursing Facility level of care (*CSW will initial, date and re-position this form in  chart as items are completed):  Yes   Patient/family provided with [facility] Clinical Social Work Department's list of facilities offering this level of care within the geographic area requested by the patient (or if unable, by the patient's family).  Yes   Patient/family informed of their freedom to choose among providers that offer the needed level of care, that participate in Medicare, Medicaid or managed care program needed by the patient, have an available bed and are willing to accept the patient.  Yes   Patient/family informed of [facility]'s ownership interest in [facility] and [facility], as well as of the fact that they are under no obligation to receive care at these facilities.  PASRR submitted to EDS on 08/30/15     PASRR number received on 08/30/15     Existing PASRR number confirmed on       FL2 transmitted to all facilities in geographic area requested by pt/family on 08/30/15     FL2 transmitted to all facilities within larger geographic area on       Patient informed that his/her managed care company has contracts with or will negotiate with certain facilities, including the following:        Yes   Patient/family informed of bed offers received.  [facility]     Physician recommends and patient chooses bed at      [facility].  Patient to be transferred to facility by PTAR     Patient family notified on 09/02/15 of transfer.  Name of family member notified:  Glenda Coleman     PHYSICIAN Please sign DNR     Additional Comment:     _______________________________________________ Mearl LatinNadia S Anastasija Anfinson, LCSWA 09/02/2015, 4:05 PM

## 2015-09-02 NOTE — Progress Notes (Signed)
Discharge orders received.  [facility].  Awaiting [facility] for transport.  Will continue to monitor.  Sondra ComeSilva, Halsey Persaud M, RN

## 2015-09-02 NOTE — Progress Notes (Signed)
Occupational Therapy Treatment Patient Details Name: Glenda Coleman MRN: [id number] DOB: March 29, 1945 Today's Date: 09/02/2015    History of present illness 70 y/o woman with PMHx significant for hypertension and advanced alzheimers dementia presents to the ED with complete right sided weakness and hemineglect. MRI on 6/17 + for acute LEFT MCA territory infarction.    OT comments  Pts progress toward OT goals limited by her cognition. Pt inconsistently following one step commands. Pt required max assist +2 for [facility]. With max cues and initiation of self feeding task pt able to participate in self feeding with RUE with overall supervision for safety. During session, pt seems to have a L preference both visually and physically. D/c plan remains appropriate. Will continue to follow acutely.   Follow Up Recommendations  SNF;Supervision/Assistance - 24 hour    Equipment Recommendations  Other (comment) (TBD at next venue)    Recommendations for Other Services      Precautions / Restrictions Precautions Precautions: Fall Restrictions Weight Bearing Restrictions: No       Mobility Bed Mobility Overal bed mobility: Needs Assistance Bed Mobility: Supine to Sit;Sit to Supine     Supine to sit: Max assist;+2 for physical assistance Sit to supine: Max assist;+2 for physical assistance   General bed mobility comments: Max cues for initiation and sequencing.  Transfers Overall transfer level: Needs assistance Equipment used: 2 person hand held assist Transfers: Sit to/from [employer]Stand;Stand Pivot Transfers Sit to Stand: Max assist;+2 physical assistance Stand pivot transfers: Max assist;+2 physical assistance       General transfer comment: Pt with decreased initiation and sequencing. Requires max assist to perform activities this session.    Balance Overall balance assessment: Needs assistance Sitting-balance support: Feet supported;No upper extremity supported Sitting  balance-Leahy Scale: Fair     Standing balance support: Bilateral upper extremity supported;During functional activity Standing balance-Leahy Scale: Poor                     ADL Overall ADL's : Needs assistance/impaired Eating/Feeding: Set up;Supervision/ safety;Cueing for sequencing;Bed level Eating/Feeding Details (indicate cue type and reason): Pt initially required max assist for self feeding. Attempted hand over hand but pt did not respond. After pt was fed a few bites, therapist placed spoon on L side of plate. Pt picked up spoon and self fed a few bites with supervision for safety.             Upper Body Dressing : Maximal assistance;Sitting;Cueing for sequencing Upper Body Dressing Details (indicate cue type and reason): To doff/don hospital gown Lower Body Dressing: Maximal assistance Lower Body Dressing Details (indicate cue type and reason): to doff/don socks Toilet Transfer: Maximal assistance;+2 for physical assistance;Stand-pivot;BSC;Cueing for sequencing Toilet Transfer Details (indicate cue type and reason): Max multimodal cues for intiation and sequencing. Toileting- Clothing Manipulation and Hygiene: Total assistance;Sit to/from stand;+2 for safety/equipment       Functional mobility during ADLs: Maximal assistance;+2 for physical assistance (for stand pivot) General ADL Comments: Pt with incontinent of urine episode upon arrival; cleaned pt up and replaced bed linens      Vision                     Perception     Praxis      Cognition   Behavior During Therapy: Flat affect Overall Cognitive Status: Impaired/Different from baseline Area of Impairment: Memory;Following commands;Safety/judgement     Memory: Decreased short-term memory  Following Commands: Follows one  step commands inconsistently Safety/Judgement: Decreased awareness of deficits;Decreased awareness of safety     General Comments: pt has h/o of advanced Alzheimers  dementia    Extremity/Trunk Assessment               Exercises     Shoulder Instructions       General Comments      Pertinent Vitals/ Pain       Pain Assessment: Faces Faces Pain Scale: No hurt  Home Living                                          Prior Functioning/Environment              Frequency Min 2X/week     Progress Toward Goals  OT Goals(current goals can now be found in the care plan section)  Progress towards OT goals: Not progressing toward goals - comment (cognition limiting progress)  Acute Rehab OT Goals Patient Stated Goal: none stated  Plan Discharge plan remains appropriate    Co-evaluation    PT/OT/SLP Co-Evaluation/Treatment: Yes Reason for Co-Treatment: Complexity of the patient's impairments (multi-system involvement);For patient/therapist safety   OT goals addressed during session: ADL's and self-care;Other (comment) (functional mobility)      End of Session Equipment Utilized During Treatment: Gait belt   Activity Tolerance Patient tolerated treatment well   Patient Left in bed;with call bell/phone within reach;with bed alarm set;with family/visitor present   Nurse Communication Other (comment) (son in room would like to speak with RN. Incontinent)        Time: [id number] OT Time Calculation (min): 35 min  Charges: OT General Charges $OT Visit: 1 Procedure OT Treatments $Self Care/Home Management : 8-22 mins  Gaye AlkenBailey A Mandeep Ferch M.S., OTR/L Pager: [phone]  09/02/2015, 3:12 PM

## 2015-12-24 ENCOUNTER — Emergency Department (HOSPITAL_COMMUNITY)
Admission: EM | Admit: 2015-12-24 | Discharge: 2015-12-24 | Disposition: A | Discharge status: Home / Self Care | Payer: Medicare Other | Attending: Emergency Medicine | Admitting: Emergency Medicine

## 2015-12-24 ENCOUNTER — Emergency Department (HOSPITAL_COMMUNITY): Payer: Medicare Other

## 2015-12-24 ENCOUNTER — Encounter (HOSPITAL_COMMUNITY): Payer: Self-pay | Admitting: *Deleted

## 2015-12-24 DIAGNOSIS — W07XXXA Fall from chair, initial encounter: Secondary | ICD-10-CM | POA: Insufficient documentation

## 2015-12-24 DIAGNOSIS — F0390 Unspecified dementia without behavioral disturbance: Secondary | ICD-10-CM | POA: Insufficient documentation

## 2015-12-24 DIAGNOSIS — E119 Type 2 diabetes mellitus without complications: Secondary | ICD-10-CM | POA: Diagnosis not present

## 2015-12-24 DIAGNOSIS — W19XXXA Unspecified fall, initial encounter: Secondary | ICD-10-CM

## 2015-12-24 DIAGNOSIS — I10 Essential (primary) hypertension: Secondary | ICD-10-CM | POA: Diagnosis not present

## 2015-12-24 DIAGNOSIS — Z791 Long term (current) use of non-steroidal anti-inflammatories (NSAID): Secondary | ICD-10-CM | POA: Diagnosis not present

## 2015-12-24 DIAGNOSIS — Y999 Unspecified external cause status: Secondary | ICD-10-CM | POA: Diagnosis not present

## 2015-12-24 DIAGNOSIS — Z7982 Long term (current) use of aspirin: Secondary | ICD-10-CM | POA: Diagnosis not present

## 2015-12-24 DIAGNOSIS — Y9289 Other specified places as the place of occurrence of the external cause: Secondary | ICD-10-CM | POA: Insufficient documentation

## 2015-12-24 DIAGNOSIS — Z79899 Other long term (current) drug therapy: Secondary | ICD-10-CM | POA: Diagnosis not present

## 2015-12-24 DIAGNOSIS — Z87891 Personal history of nicotine dependence: Secondary | ICD-10-CM | POA: Insufficient documentation

## 2015-12-24 DIAGNOSIS — Y939 Activity, unspecified: Secondary | ICD-10-CM | POA: Diagnosis not present

## 2015-12-24 NOTE — ED Notes (Signed)
Bed: [facility] Expected date:  Expected time:  Means of arrival:  Comments: 

## 2015-12-24 NOTE — ED Triage Notes (Signed)
Per EMS [facility] following a witnessed fall from the chair of her walker.  The walker wasn't locked and she slipped out from under her.  No LOC, no blood thinners.  Staff said patient hit her head, but no hematoma noted.  Patient has a hx of dementia and CVA, but is at baseline mental status per staff and nursing facility.  Patient's vitals, 140/70, HR 68, RR 16, 98% on RA.

## 2015-12-24 NOTE — ED Provider Notes (Signed)
[facility]-EMERGENCY DEPT Provider Note   CSN: [id number] Arrival date & time: 12/24/15  1135     History   Chief Complaint Chief Complaint  Patient presents with  . Fall    HPI Glenda Coleman is a 70 y.o. female.  HPI Remainder of history, ROS, and physical exam limited due to patient's condition (dementia). Additional information was obtained from either EMS and family.   Level V Caveat.  Witnessed fall from the chair of her walker.  The walker wasn't locked and she slipped out from under her.  No LOC, no blood thinners.  Staff said patient hit her head, but no hematoma noted.  Patient has a hx of dementia and CVA, but is at baseline mental status per staff and nursing facility Past Medical History:  Diagnosis Date  . Alzheimer disease   . Arthritis    "back" (08/27/2015)  . Chronic lower back pain   . Dementia    "severe" (08/27/2015)  . Depression   . Hypertension   . Stroke (HCC) 08/27/2015  . TIA (transient ischemic attack) dx'd 2015   "dr said she'd had a few; put her on aspirin at that point"  . Type II diabetes mellitus [facility](HCC)     Patient Active Problem List   Diagnosis Date Noted  . DNR (do not resuscitate)   . Palliative care encounter   . Goals of care, counseling/discussion   . Acute CVA (cerebrovascular accident) (HCC) 08/28/2015  . Stroke (cerebrum) (HCC)   . Dysphagia, pharyngoesophageal phase   . Essential hypertension   . HLD (hyperlipidemia)   . Dementia   . CVA (cerebral vascular accident) (HCC) 08/27/2015    Past Surgical History:  Procedure Laterality Date  . ABDOMINAL HYSTERECTOMY    . PARTIAL KNEE ARTHROPLASTY Left     OB History    No data available       Home Medications    Prior to Admission medications   Medication Sig Start Date End Date Taking? Authorizing Provider  acetaminophen (TYLENOL) 500 MG tablet Take 500 mg by mouth every 6 (six) hours as needed for moderate pain.    Historical Provider, MD  amoxicillin (AMOXIL) 500 MG  capsule Take 2,000 mg by mouth See admin instructions. Take 4 capsules prior to dental appts    Historical Provider, MD  aspirin EC 81 MG EC tablet Take 1 tablet (81 mg total) by mouth daily. 09/02/15   Fuller Planhristopher W Rice, MD  Biotin 5 MG TABS Take 5 mg by mouth daily.    Historical Provider, MD  calcium-vitamin D (OSCAL WITH D) 500-200 MG-UNIT tablet Take 1 tablet by mouth 2 (two) times daily.    Historical Provider, MD  donepezil (ARICEPT) 10 MG tablet Take 20 mg by mouth daily.  09/24/14   Historical Provider, MD  hydrochlorothiazide (HYDRODIURIL) 25 MG tablet Take 25 mg by mouth daily. 08/28/14   Historical Provider, MD  lisinopril (PRINIVIL,ZESTRIL) 5 MG tablet Take 5 mg by mouth daily. 09/08/14   Historical Provider, MD  memantine (NAMENDA) 10 MG tablet Take 10 mg by mouth daily.  09/30/14   Historical Provider, MD  Multiple Vitamin (DAILY VITE PO) Take 1 tablet by mouth daily.    Historical Provider, MD  naproxen (NAPROSYN) 500 MG tablet Take 500 mg by mouth 2 (two) times daily with a meal.    Historical Provider, MD  Omega-3 Fatty Acids (FISH OIL) 1000 MG CAPS Take 2,000 mg by mouth daily.    Historical Provider, MD  risperiDONE (  RISPERDAL) 1 MG tablet Take 1 tablet (1 mg total) by mouth as directed. Take 0.5mg  qAM and 1mg  qHS 09/02/15   Fuller Plan, MD  sertraline (ZOLOFT) 100 MG tablet Take 100 mg by mouth daily.    Historical Provider, MD  simvastatin (ZOCOR) 40 MG tablet Take 40 mg by mouth daily. 09/15/14   Historical Provider, MD    Family History No family history on file.  Social History Social History  Substance Use Topics  . Smoking status: Former Smoker    Years: 25.00    Types: Cigarettes  . Smokeless tobacco: Former Neurosurgeon    Types: Chew     Comment: "stoped smoking cigarettes & chewng in the 1980s"  . Alcohol use Yes     Comment: 08/27/2015 "quit in the 1970s"     Allergies   Review of patient's allergies indicates no known allergies.   Review of Systems Review  of Systems  Unable to perform ROS: Dementia     Physical Exam Updated Vital Signs BP 133/69   Pulse 62   Temp 97.9 F (36.6 C) (Oral)   Resp 16   SpO2 96%   Physical Exam  Constitutional: She is oriented to person, place, and time. She appears well-developed and well-nourished. No distress.  HENT:  Head: Normocephalic and atraumatic.  Right Ear: External ear normal.  Left Ear: External ear normal.  Nose: Nose normal.  Eyes: Conjunctivae and EOM are normal. Pupils are equal, round, and reactive to light. Right eye exhibits no discharge. Left eye exhibits no discharge. No scleral icterus.  Neck: Normal range of motion. Neck supple.  Cardiovascular: Normal rate, regular rhythm and normal heart sounds.  Exam reveals no gallop and no friction rub.   No murmur heard. Pulses:      Radial pulses are 2+ on the right side, and 2+ on the left side.       Dorsalis pedis pulses are 2+ on the right side, and 2+ on the left side.  Pulmonary/Chest: Effort normal and breath sounds normal. No stridor. No respiratory distress. She has no wheezes.  Abdominal: Soft. She exhibits no distension. There is no tenderness.  Musculoskeletal: She exhibits no edema or tenderness.       Cervical back: She exhibits no bony tenderness.       Thoracic back: She exhibits no bony tenderness.       Lumbar back: She exhibits no bony tenderness.  Clavicles stable. Chest stable to AP/Lat compression. Pelvis stable to Lat compression. No obvious extremity deformity.   Neurological: She is alert and oriented to person, place, and time.  Moving all extremities  Skin: Skin is warm and dry. No rash noted. She is not diaphoretic. No erythema.  Psychiatric: She has a normal mood and affect.     ED Treatments / Results  Labs (all labs ordered are listed, but only abnormal results are displayed) Labs Reviewed - No data to display  EKG  EKG Interpretation None       Radiology Dg Lumbar Spine 2-3  Views  Result Date: 12/24/2015 CLINICAL DATA:  Fall.  Back pain EXAM: LUMBAR SPINE - 2-3 VIEW COMPARISON:  None. FINDINGS: Negative for fracture.  No acute bony abnormality Mild disc degeneration and mild anterior slip L3-4. Moderate disc degeneration and spurring L4-5. Advanced disc degeneration and spurring L5-S1. IMPRESSION: Negative for fracture. Electronically Signed   By: Marlan Palau M.D.   On: 12/24/2015 13:50   Ct Head Wo Contrast  Result Date:  12/24/2015 CLINICAL DATA:  Fall at nursing facility.  No loss of consciousness. EXAM: CT HEAD WITHOUT CONTRAST CT CERVICAL SPINE WITHOUT CONTRAST TECHNIQUE: Multidetector CT imaging of the head and cervical spine was performed following the standard protocol without intravenous contrast. Multiplanar CT image reconstructions of the cervical spine were also generated. COMPARISON:  CT scan of August 27, 2015. FINDINGS: CT HEAD FINDINGS Brain: Left parietal encephalomalacia is noted consistent with old infarction. Moderate chronic ischemic white matter disease is noted. No mass effect or midline shift is noted. Ventricular size is within normal limits. There is no evidence of mass lesion, hemorrhage or acute infarction. Stable old cerebellar infarctions are noted. Vascular: No definite abnormality seen. Skull: Bony calvarium appears intact. Sinuses/Orbits: Right maxillary sinusitis is noted. Other: None. CT CERVICAL SPINE FINDINGS Alignment: Normal. Skull base and vertebrae: No fracture is noted. Soft tissues and spinal canal: No definite soft tissue abnormality seen. Disc levels: Severe degenerative disc disease is noted at C5-6 with anterior osteophyte formation. Upper chest: Visualized lung apices appear normal. Other: Degenerative changes is seen involving the right-sided posterior facet joints. IMPRESSION: Left parietal encephalomalacia consistent with old large infarction. Moderate chronic ischemic white matter disease. No acute intracranial abnormality  seen. Severe degenerative disc disease is noted at C5-6. No acute abnormality seen in the cervical spine. Electronically Signed   By: Lupita Raider, M.D.   On: 12/24/2015 14:15   Ct Cervical Spine Wo Contrast  Result Date: 12/24/2015 CLINICAL DATA:  Fall at nursing facility.  No loss of consciousness. EXAM: CT HEAD WITHOUT CONTRAST CT CERVICAL SPINE WITHOUT CONTRAST TECHNIQUE: Multidetector CT imaging of the head and cervical spine was performed following the standard protocol without intravenous contrast. Multiplanar CT image reconstructions of the cervical spine were also generated. COMPARISON:  CT scan of August 27, 2015. FINDINGS: CT HEAD FINDINGS Brain: Left parietal encephalomalacia is noted consistent with old infarction. Moderate chronic ischemic white matter disease is noted. No mass effect or midline shift is noted. Ventricular size is within normal limits. There is no evidence of mass lesion, hemorrhage or acute infarction. Stable old cerebellar infarctions are noted. Vascular: No definite abnormality seen. Skull: Bony calvarium appears intact. Sinuses/Orbits: Right maxillary sinusitis is noted. Other: None. CT CERVICAL SPINE FINDINGS Alignment: Normal. Skull base and vertebrae: No fracture is noted. Soft tissues and spinal canal: No definite soft tissue abnormality seen. Disc levels: Severe degenerative disc disease is noted at C5-6 with anterior osteophyte formation. Upper chest: Visualized lung apices appear normal. Other: Degenerative changes is seen involving the right-sided posterior facet joints. IMPRESSION: Left parietal encephalomalacia consistent with old large infarction. Moderate chronic ischemic white matter disease. No acute intracranial abnormality seen. Severe degenerative disc disease is noted at C5-6. No acute abnormality seen in the cervical spine. Electronically Signed   By: Lupita Raider, M.D.   On: 12/24/2015 14:15   Dg Hips Bilat W Or Wo Pelvis 3-4 Views  Result Date:  12/24/2015 CLINICAL DATA:  Fall.  Pain. EXAM: DG HIP (WITH OR WITHOUT PELVIS) 3-4V BILAT COMPARISON:  None. FINDINGS: Negative for hip fracture bilaterally. No pelvic fracture. Mild to moderate degenerative change in the hip joint bilaterally. Soft tissue calcification left pelvis likely calcified uterine fibroid. IMPRESSION: Negative for fracture. Electronically Signed   By: Marlan Palau M.D.   On: 12/24/2015 13:51    Procedures Procedures (including critical care time)  Medications Ordered in ED Medications - No data to display   Initial Impression / Assessment and Plan /  ED Course  I have reviewed the triage vital signs and the nursing notes.  Pertinent labs & imaging results that were available during my care of the patient were reviewed by me and considered in my medical decision making (see chart for details).  Clinical Course    Mechanical fall. No LOC. No evidence of acute injury on exam. CT head and cervical spine negative. Plain film of the lumbar spine and bilateral hips negative.  Safe for discharge with strict return precautions.  Final Clinical Impressions(s) / ED Diagnoses   Final diagnoses:  Fall, initial encounter   Disposition: Discharge  Condition: Good  I have discussed the results, Dx and Tx plan with the patient who expressed understanding and agree(s) with the plan. Discharge instructions discussed at great length. The patientwas given strict return precautions who verbalized understanding of the instructions. No further questions at time of discharge.    Current Discharge Medication List      Follow Up: Elinor Dodge, MD [address] [place] [address] [phone]  Schedule an appointment as soon as possible for a visit  As needed      Nira Conn, MD 12/24/15 1430

## 2015-12-30 ENCOUNTER — Emergency Department (HOSPITAL_COMMUNITY)
Admission: EM | Admit: 2015-12-30 | Discharge: 2015-12-30 | Disposition: A | Discharge status: Home / Self Care | Payer: Medicare Other | Attending: Emergency Medicine | Admitting: Emergency Medicine

## 2015-12-30 ENCOUNTER — Emergency Department (HOSPITAL_COMMUNITY): Payer: Medicare Other

## 2015-12-30 ENCOUNTER — Encounter (HOSPITAL_COMMUNITY): Payer: Self-pay | Admitting: *Deleted

## 2015-12-30 DIAGNOSIS — Z7982 Long term (current) use of aspirin: Secondary | ICD-10-CM | POA: Diagnosis not present

## 2015-12-30 DIAGNOSIS — Y999 Unspecified external cause status: Secondary | ICD-10-CM | POA: Insufficient documentation

## 2015-12-30 DIAGNOSIS — Y939 Activity, unspecified: Secondary | ICD-10-CM | POA: Insufficient documentation

## 2015-12-30 DIAGNOSIS — Z79899 Other long term (current) drug therapy: Secondary | ICD-10-CM | POA: Diagnosis not present

## 2015-12-30 DIAGNOSIS — W19XXXA Unspecified fall, initial encounter: Secondary | ICD-10-CM

## 2015-12-30 DIAGNOSIS — Z87891 Personal history of nicotine dependence: Secondary | ICD-10-CM | POA: Diagnosis not present

## 2015-12-30 DIAGNOSIS — Z96652 Presence of left artificial knee joint: Secondary | ICD-10-CM | POA: Diagnosis not present

## 2015-12-30 DIAGNOSIS — I10 Essential (primary) hypertension: Secondary | ICD-10-CM | POA: Diagnosis not present

## 2015-12-30 DIAGNOSIS — Y929 Unspecified place or not applicable: Secondary | ICD-10-CM | POA: Diagnosis not present

## 2015-12-30 DIAGNOSIS — W07XXXA Fall from chair, initial encounter: Secondary | ICD-10-CM | POA: Diagnosis not present

## 2015-12-30 DIAGNOSIS — F0390 Unspecified dementia without behavioral disturbance: Secondary | ICD-10-CM | POA: Insufficient documentation

## 2015-12-30 DIAGNOSIS — E119 Type 2 diabetes mellitus without complications: Secondary | ICD-10-CM | POA: Diagnosis not present

## 2015-12-30 DIAGNOSIS — Z8673 Personal history of transient ischemic attack (TIA), and cerebral infarction without residual deficits: Secondary | ICD-10-CM | POA: Insufficient documentation

## 2015-12-30 NOTE — ED Triage Notes (Signed)
Patient is alert and oriented to baseline per facility.  Patient slid out of her chair today and fell forward hitting her head.  Patient has no Hx of anticoagulants.  No deformities or bleeding noted on arrival.

## 2015-12-30 NOTE — ED Notes (Signed)
Bed: WA20 Expected date:  Expected time:  Means of arrival:  Comments: EMS-fall 

## 2015-12-30 NOTE — ED Provider Notes (Signed)
[facility]-EMERGENCY DEPT Provider Note   CSN: [id number] Arrival date & time: 12/30/15  1114     History   Chief Complaint Chief Complaint  Patient presents with  . Fall    HPI Glenda Coleman is a 70 y.o. female.  HPI 70 year old female who presents after fall. From nursing facility.  Level V caveat due to dementia. History of alzheimer's dementia, prior CVA, DM.  Witnessed fall from chair today and hit head. No blood thinners. Unknown LOC.  Facility and her brother states that she is at her baseline since fall.   Past Medical History:  Diagnosis Date  . Alzheimer disease   . Arthritis    "back" (08/27/2015)  . Chronic lower back pain   . Dementia    "severe" (08/27/2015)  . Depression   . Hypertension   . Stroke (HCC) 08/27/2015  . TIA (transient ischemic attack) dx'd 2015   "dr said she'd had a few; put her on aspirin at that point"  . Type II diabetes mellitus [facility])     Patient Active Problem List   Diagnosis Date Noted  . DNR (do not resuscitate)   . Palliative care encounter   . Goals of care, counseling/discussion   . Acute CVA (cerebrovascular accident) (HCC) 08/28/2015  . Stroke (cerebrum) (HCC)   . Dysphagia, pharyngoesophageal phase   . Essential hypertension   . HLD (hyperlipidemia)   . Dementia   . CVA (cerebral vascular accident) (HCC) 08/27/2015    Past Surgical History:  Procedure Laterality Date  . ABDOMINAL HYSTERECTOMY    . PARTIAL KNEE ARTHROPLASTY Left     OB History    No data available       Home Medications    Prior to Admission medications   Medication Sig Start Date End Date Taking? Authorizing Provider  acetaminophen (TYLENOL) 500 MG tablet Take 500 mg by mouth every 4 (four) hours as needed for mild pain, moderate pain, fever or headache.    Yes Historical Provider, MD  alum & mag hydroxide-simeth (MINTOX) 200-200-20 MG/5ML suspension Take 30 mLs by mouth as needed for indigestion or heartburn.   Yes Historical Provider, MD    aspirin EC 81 MG EC tablet Take 1 tablet (81 mg total) by mouth daily. Patient taking differently: Take 81 mg by mouth daily with breakfast.  09/02/15  Yes Fuller Plan, MD  atorvastatin (LIPITOR) 20 MG tablet Take 20 mg by mouth at bedtime.   Yes Historical Provider, MD  Biotin 5 MG TABS Take 5 mg by mouth daily with breakfast.    Yes Historical Provider, MD  calcium-vitamin D (OSCAL WITH D) 500-200 MG-UNIT tablet Take 1 tablet by mouth 2 (two) times daily.   Yes Historical Provider, MD  divalproex (DEPAKOTE SPRINKLE) 125 MG capsule Take 250 mg by mouth 2 (two) times daily.   Yes Historical Provider, MD  donepezil (ARICEPT) 10 MG tablet Take 20 mg by mouth daily with breakfast.  09/24/14  Yes Historical Provider, MD  guaifenesin (ROBAFEN) 100 MG/5ML syrup Take 200 mg by mouth every 6 (six) hours as needed for cough.   Yes Historical Provider, MD  hydrochlorothiazide (HYDRODIURIL) 25 MG tablet Take 25 mg by mouth daily with breakfast.  08/28/14  Yes Historical Provider, MD  lisinopril (PRINIVIL,ZESTRIL) 5 MG tablet Take 5 mg by mouth daily with breakfast.  09/08/14  Yes Historical Provider, MD  loperamide (IMODIUM) 2 MG capsule Take 2 mg by mouth as needed for diarrhea or loose stools.  Yes Historical Provider, MD  LORazepam (ATIVAN) 0.5 MG tablet Take 0.5 mg by mouth 2 (two) times daily.    Yes Historical Provider, MD  magnesium hydroxide (MILK OF MAGNESIA) 400 MG/5ML suspension Take 30 mLs by mouth at bedtime as needed for mild constipation.   Yes Historical Provider, MD  memantine (NAMENDA) 10 MG tablet Take 10 mg by mouth daily with breakfast.  09/30/14  Yes Historical Provider, MD  Multiple Vitamin (DAILY VITE PO) Take 1 tablet by mouth daily with breakfast.    Yes Historical Provider, MD  naproxen (NAPROSYN) 500 MG tablet Take 500 mg by mouth 2 (two) times daily with a meal.   Yes Historical Provider, MD  neomycin-bacitracin-polymyxin (NEOSPORIN) ointment Apply 1 application topically as  needed for wound care.   Yes Historical Provider, MD  Omega-3 Fatty Acids (FISH OIL) 1000 MG CAPS Take 2,000 mg by mouth daily with breakfast.    Yes Historical Provider, MD  PRESCRIPTION MEDICATION Apply 0.5 mLs topically every 8 (eight) hours as needed (for agitation). Apply to wrist every 8 hours as needed for agitation   Yes Historical Provider, MD  risperiDONE (RISPERDAL) 1 MG tablet Take 1 tablet (1 mg total) by mouth as directed. Take 0.5mg  qAM and 1mg  qHS Patient taking differently: Take 0.5-1 mg by mouth 2 (two) times daily. Takes 0.5mg  in the morning and 1mg  at bedtime 09/02/15  Yes Fuller Planhristopher W Rice, MD  sertraline (ZOLOFT) 100 MG tablet Take 100 mg by mouth daily with breakfast.    Yes Historical Provider, MD  amoxicillin (AMOXIL) 500 MG capsule Take 2,000 mg by mouth See admin instructions. Take 4 capsules prior to dental appts    Historical Provider, MD    Family History History reviewed. No pertinent family history.  Social History Social History  Substance Use Topics  . Smoking status: Former Smoker    Years: 25.00    Types: Cigarettes  . Smokeless tobacco: Former NeurosurgeonUser    Types: Chew     Comment: "stoped smoking cigarettes & chewng in the 1980s"  . Alcohol use Yes     Comment: 08/27/2015 "quit in the 1970s"     Allergies   Review of patient's allergies indicates no known allergies.   Review of Systems Review of Systems Unable to obtain due to dementia  Physical Exam Updated Vital Signs BP 148/87 (BP Location: Right Arm)   Pulse 80   Temp 97.7 F (36.5 C) (Oral)   Resp 16   Ht 5\' 8"  (1.727 m)   Wt 165 lb (74.8 kg)   SpO2 98%   BMI 25.09 kg/m   Physical Exam Physical Exam  Nursing note and vitals reviewed. Constitutional: Well developed, well nourished, non-toxic, and in no acute distress Head: Normocephalic and atraumatic.  Mouth/Throat: Oropharynx is clear and moist.  Neck: Normal range of motion. Neck supple.  Cardiovascular: Normal rate and  regular rhythm.   Pulmonary/Chest: Effort normal and breath sounds normal.  Abdominal: Soft. There is no tenderness. There is no rebound and no guarding.  Musculoskeletal: Normal range of motion.  Neurological: Alert, no facial droop, non-verbal, moves all extremities symmetrically, obeys simple commands Skin: Skin is warm and dry.  Psychiatric: Cooperative   ED Treatments / Results  Labs (all labs ordered are listed, but only abnormal results are displayed) Labs Reviewed - No data to display  EKG  EKG Interpretation None       Radiology Ct Head Wo Contrast  Result Date: 12/30/2015 CLINICAL DATA:  Witnessed  fall, dementia EXAM: CT HEAD WITHOUT CONTRAST CT CERVICAL SPINE WITHOUT CONTRAST TECHNIQUE: Multidetector CT imaging of the head and cervical spine was performed following the standard protocol without intravenous contrast. Multiplanar CT image reconstructions of the cervical spine were also generated. COMPARISON:  12/24/2015 FINDINGS: CT HEAD FINDINGS Motion degraded images. Brain: No evidence of acute infarction, hemorrhage, hydrocephalus, extra-axial collection or mass lesion/mass effect. Encephalomalacic changes related to old left parietal infarct. Vascular: No hyperdense vessel or unexpected calcification. Skull: Normal. Negative for fracture or focal lesion. Sinuses/Orbits: No acute finding. Other: Global cortical and central atrophy. Secondary ventriculomegaly. Subcortical white matter and periventricular small vessel ischemic changes. CT CERVICAL SPINE FINDINGS Alignment: Normal. Skull base and vertebrae: No acute fracture. No primary bone lesion or focal pathologic process. Soft tissues and spinal canal: No prevertebral fluid or swelling. No visible canal hematoma. Disc levels:  Degenerative changes at C5-6. Spinal canal is patent. Upper chest: Visualized lung apices are clear. Other: Visualized thyroid is mildly heterogeneous. IMPRESSION: No evidence of acute intracranial  abnormality. Old left parietal infarct. Atrophy with small vessel ischemic changes. No evidence traumatic injury to the cervical spine. Mild degenerative changes at C5-6. Electronically Signed   By: Charline Bills M.D.   On: 12/30/2015 13:00   Ct Cervical Spine Wo Contrast  Result Date: 12/30/2015 CLINICAL DATA:  Witnessed fall, dementia EXAM: CT HEAD WITHOUT CONTRAST CT CERVICAL SPINE WITHOUT CONTRAST TECHNIQUE: Multidetector CT imaging of the head and cervical spine was performed following the standard protocol without intravenous contrast. Multiplanar CT image reconstructions of the cervical spine were also generated. COMPARISON:  12/24/2015 FINDINGS: CT HEAD FINDINGS Motion degraded images. Brain: No evidence of acute infarction, hemorrhage, hydrocephalus, extra-axial collection or mass lesion/mass effect. Encephalomalacic changes related to old left parietal infarct. Vascular: No hyperdense vessel or unexpected calcification. Skull: Normal. Negative for fracture or focal lesion. Sinuses/Orbits: No acute finding. Other: Global cortical and central atrophy. Secondary ventriculomegaly. Subcortical white matter and periventricular small vessel ischemic changes. CT CERVICAL SPINE FINDINGS Alignment: Normal. Skull base and vertebrae: No acute fracture. No primary bone lesion or focal pathologic process. Soft tissues and spinal canal: No prevertebral fluid or swelling. No visible canal hematoma. Disc levels:  Degenerative changes at C5-6. Spinal canal is patent. Upper chest: Visualized lung apices are clear. Other: Visualized thyroid is mildly heterogeneous. IMPRESSION: No evidence of acute intracranial abnormality. Old left parietal infarct. Atrophy with small vessel ischemic changes. No evidence traumatic injury to the cervical spine. Mild degenerative changes at C5-6. Electronically Signed   By: Charline Bills M.D.   On: 12/30/2015 13:00    Procedures Procedures (including critical care  time)  Medications Ordered in ED Medications - No data to display   Initial Impression / Assessment and Plan / ED Course  I have reviewed the triage vital signs and the nursing notes.  Pertinent labs & imaging results that were available during my care of the patient were reviewed by me and considered in my medical decision making (see chart for details).  Clinical Course    70 year old female who presents after fall from chair. Vital signs within normal limits. No evidence of serious traumatic injury on exam. Brother at bedside states they have been sedating her more with ativan recently, which she why she is not very verbal, but states that this is her baseline. CT head and cervical spine visualized and negative for acute traumatic injuries. No other injuries suspected on the exam. She is stable for discharge back to facility.  Final Clinical Impressions(s) / ED Diagnoses   Final diagnoses:  Fall, initial encounter    New Prescriptions Discharge Medication List as of 12/30/2015  1:17 PM       Lavera Guise, MD 12/30/15 1824

## 2015-12-30 NOTE — Discharge Instructions (Signed)
Your CT scans does not show serious head injury or neck injury.  Please return without fail for worsening symptoms, including confusion, intractable vomiting, escalating pain, or any other symptoms concerning to you

## 2015-12-30 NOTE — ED Notes (Signed)
[facility]

## 2015-12-30 NOTE — ED Notes (Signed)
Patient is alert and oriented to baseline.  PTAR  was given DC instructions and follow up visit instructions.  Patient gave verbal understanding. She was DC via Agricultural consultantstrectcher to nursing home.  V/S stable.  He was not showing any signs of distress on DC

## 2016-01-05 ENCOUNTER — Emergency Department (HOSPITAL_COMMUNITY): Payer: Medicare Other

## 2016-01-05 ENCOUNTER — Encounter (HOSPITAL_COMMUNITY): Payer: Self-pay | Admitting: Emergency Medicine

## 2016-01-05 ENCOUNTER — Inpatient Hospital Stay (HOSPITAL_COMMUNITY)
Admission: EM | Admit: 2016-01-05 | Discharge: 2016-01-10 | DRG: 689 | Disposition: A | Discharge status: Nursing Home (Includes ICF) | Payer: Medicare Other | Attending: Family Medicine | Admitting: Family Medicine

## 2016-01-05 DIAGNOSIS — Z87891 Personal history of nicotine dependence: Secondary | ICD-10-CM | POA: Diagnosis not present

## 2016-01-05 DIAGNOSIS — F0281 Dementia in other diseases classified elsewhere with behavioral disturbance: Secondary | ICD-10-CM

## 2016-01-05 DIAGNOSIS — G9340 Encephalopathy, unspecified: Secondary | ICD-10-CM | POA: Diagnosis not present

## 2016-01-05 DIAGNOSIS — E785 Hyperlipidemia, unspecified: Secondary | ICD-10-CM | POA: Diagnosis present

## 2016-01-05 DIAGNOSIS — I6339 Cerebral infarction due to thrombosis of other cerebral artery: Secondary | ICD-10-CM

## 2016-01-05 DIAGNOSIS — G308 Other Alzheimer's disease: Secondary | ICD-10-CM

## 2016-01-05 DIAGNOSIS — W050XXA Fall from non-moving wheelchair, initial encounter: Secondary | ICD-10-CM | POA: Diagnosis present

## 2016-01-05 DIAGNOSIS — G309 Alzheimer's disease, unspecified: Secondary | ICD-10-CM | POA: Diagnosis present

## 2016-01-05 DIAGNOSIS — G9341 Metabolic encephalopathy: Secondary | ICD-10-CM | POA: Diagnosis present

## 2016-01-05 DIAGNOSIS — E870 Hyperosmolality and hypernatremia: Secondary | ICD-10-CM | POA: Diagnosis present

## 2016-01-05 DIAGNOSIS — Z79899 Other long term (current) drug therapy: Secondary | ICD-10-CM

## 2016-01-05 DIAGNOSIS — R1314 Dysphagia, pharyngoesophageal phase: Secondary | ICD-10-CM | POA: Diagnosis present

## 2016-01-05 DIAGNOSIS — Z7982 Long term (current) use of aspirin: Secondary | ICD-10-CM | POA: Diagnosis not present

## 2016-01-05 DIAGNOSIS — N390 Urinary tract infection, site not specified: Secondary | ICD-10-CM | POA: Diagnosis not present

## 2016-01-05 DIAGNOSIS — F0280 Dementia in other diseases classified elsewhere without behavioral disturbance: Secondary | ICD-10-CM | POA: Diagnosis present

## 2016-01-05 DIAGNOSIS — Z8673 Personal history of transient ischemic attack (TIA), and cerebral infarction without residual deficits: Secondary | ICD-10-CM | POA: Diagnosis not present

## 2016-01-05 DIAGNOSIS — N179 Acute kidney failure, unspecified: Secondary | ICD-10-CM | POA: Diagnosis not present

## 2016-01-05 DIAGNOSIS — Y92099 Unspecified place in other non-institutional residence as the place of occurrence of the external cause: Secondary | ICD-10-CM | POA: Diagnosis not present

## 2016-01-05 DIAGNOSIS — E119 Type 2 diabetes mellitus without complications: Secondary | ICD-10-CM | POA: Diagnosis present

## 2016-01-05 DIAGNOSIS — E860 Dehydration: Secondary | ICD-10-CM | POA: Diagnosis present

## 2016-01-05 DIAGNOSIS — Z791 Long term (current) use of non-steroidal anti-inflammatories (NSAID): Secondary | ICD-10-CM | POA: Diagnosis not present

## 2016-01-05 DIAGNOSIS — I10 Essential (primary) hypertension: Secondary | ICD-10-CM | POA: Diagnosis not present

## 2016-01-05 DIAGNOSIS — I639 Cerebral infarction, unspecified: Secondary | ICD-10-CM | POA: Diagnosis present

## 2016-01-05 DIAGNOSIS — F329 Major depressive disorder, single episode, unspecified: Secondary | ICD-10-CM | POA: Diagnosis present

## 2016-01-05 DIAGNOSIS — F0390 Unspecified dementia without behavioral disturbance: Secondary | ICD-10-CM | POA: Diagnosis present

## 2016-01-05 DIAGNOSIS — Z96652 Presence of left artificial knee joint: Secondary | ICD-10-CM | POA: Diagnosis present

## 2016-01-05 DIAGNOSIS — N3000 Acute cystitis without hematuria: Secondary | ICD-10-CM

## 2016-01-05 DIAGNOSIS — B9620 Unspecified Escherichia coli [E. coli] as the cause of diseases classified elsewhere: Secondary | ICD-10-CM | POA: Diagnosis present

## 2016-01-05 DIAGNOSIS — R296 Repeated falls: Secondary | ICD-10-CM

## 2016-01-05 LAB — COMPREHENSIVE METABOLIC PANEL
ALBUMIN: 4.4 g/dL (ref 3.5–5.0)
ALK PHOS: 58 U/L (ref 38–126)
ALT: 21 U/L (ref 14–54)
AST: 18 U/L (ref 15–41)
Anion gap: 10 (ref 5–15)
BUN: 70 mg/dL — ABNORMAL HIGH (ref 6–20)
CALCIUM: 10 mg/dL (ref 8.9–10.3)
CHLORIDE: 120 mmol/L — AB (ref 101–111)
CO2: 29 mmol/L (ref 22–32)
CREATININE: 1.61 mg/dL — AB (ref 0.44–1.00)
GFR calc Af Amer: 36 mL/min — ABNORMAL LOW (ref >60)
GFR calc non Af Amer: 31 mL/min — ABNORMAL LOW (ref >60)
GLUCOSE: 116 mg/dL — AB (ref 65–99)
Potassium: 3.8 mmol/L (ref 3.5–5.1)
SODIUM: 159 mmol/L — AB (ref 135–145)
Total Bilirubin: 0.6 mg/dL (ref 0.3–1.2)
Total Protein: 8.2 g/dL — ABNORMAL HIGH (ref 6.5–8.1)

## 2016-01-05 LAB — URINALYSIS, ROUTINE W REFLEX MICROSCOPIC
BILIRUBIN URINE: NEGATIVE
GLUCOSE, UA: NEGATIVE mg/dL
KETONES UR: NEGATIVE mg/dL
Nitrite: POSITIVE — AB
PH: 7 (ref 5.0–8.0)
Protein, ur: NEGATIVE mg/dL
SPECIFIC GRAVITY, URINE: 1.024 (ref 1.005–1.030)

## 2016-01-05 LAB — CBC WITH DIFFERENTIAL/PLATELET
BASOS ABS: 0 10*3/uL (ref 0.0–0.1)
BASOS PCT: 0 %
EOS ABS: 0 10*3/uL (ref 0.0–0.7)
Eosinophils Relative: 1 %
HCT: 42.9 % (ref 36.0–46.0)
HEMOGLOBIN: 13.6 g/dL (ref 12.0–15.0)
LYMPHS ABS: 2.2 10*3/uL (ref 0.7–4.0)
Lymphocytes Relative: 30 %
MCH: 28.2 pg (ref 26.0–34.0)
MCHC: 31.7 g/dL (ref 30.0–36.0)
MCV: 89 fL (ref 78.0–100.0)
Monocytes Absolute: 0.7 10*3/uL (ref 0.1–1.0)
Monocytes Relative: 9 %
NEUTROS PCT: 60 %
Neutro Abs: 4.4 10*3/uL (ref 1.7–7.7)
PLATELETS: 296 10*3/uL (ref 150–400)
RBC: 4.82 MIL/uL (ref 3.87–5.11)
RDW: 14.3 % (ref 11.5–15.5)
WBC: 7.3 10*3/uL (ref 4.0–10.5)

## 2016-01-05 LAB — URINE MICROSCOPIC-ADD ON

## 2016-01-05 LAB — SODIUM: SODIUM: 156 mmol/L — AB (ref 135–145)

## 2016-01-05 MED ORDER — HALOPERIDOL LACTATE 5 MG/ML IJ SOLN
1.0000 mg | Freq: Four times a day (QID) | INTRAMUSCULAR | Status: DC | PRN
  Administered 2016-01-05 – 2016-01-09 (×2): 1 mg via INTRAVENOUS
  Filled 2016-01-05 (×3): qty 1

## 2016-01-05 MED ORDER — CALCIUM CARBONATE-VITAMIN D 500-200 MG-UNIT PO TABS
1.0000 | ORAL_TABLET | Freq: Two times a day (BID) | ORAL | Status: DC
  Administered 2016-01-05 – 2016-01-10 (×9): 1 via ORAL
  Filled 2016-01-05 (×10): qty 1

## 2016-01-05 MED ORDER — DIVALPROEX SODIUM 125 MG PO CSDR
250.0000 mg | DELAYED_RELEASE_CAPSULE | Freq: Two times a day (BID) | ORAL | Status: DC
  Administered 2016-01-05 – 2016-01-10 (×9): 250 mg via ORAL
  Filled 2016-01-05 (×10): qty 2

## 2016-01-05 MED ORDER — HALOPERIDOL LACTATE 5 MG/ML IJ SOLN
5.0000 mg | Freq: Once | INTRAMUSCULAR | Status: AC
  Administered 2016-01-05: 5 mg via INTRAVENOUS
  Filled 2016-01-05: qty 1

## 2016-01-05 MED ORDER — RISPERIDONE 1 MG PO TABS
1.0000 mg | ORAL_TABLET | Freq: Every day | ORAL | Status: DC
  Administered 2016-01-05 – 2016-01-09 (×4): 1 mg via ORAL
  Filled 2016-01-05 (×5): qty 1

## 2016-01-05 MED ORDER — RISPERIDONE 1 MG PO TABS: 1.0000 mg | ORAL_TABLET | ORAL | Status: DC

## 2016-01-05 MED ORDER — ALUM & MAG HYDROXIDE-SIMETH 200-200-20 MG/5ML PO SUSP: 30.0000 mL | ORAL | Status: DC | PRN

## 2016-01-05 MED ORDER — SODIUM CHLORIDE 0.45 % IV BOLUS
500.0000 mL | Freq: Once | INTRAVENOUS | Status: AC
  Administered 2016-01-05: 500 mL via INTRAVENOUS

## 2016-01-05 MED ORDER — DEXTROSE 5 % IV SOLN
1.0000 g | Freq: Once | INTRAVENOUS | Status: AC
  Administered 2016-01-05: 1 g via INTRAVENOUS
  Filled 2016-01-05: qty 10

## 2016-01-05 MED ORDER — SODIUM CHLORIDE 0.9 % IV SOLN
INTRAVENOUS | Status: DC
  Administered 2016-01-05: 15:00:00 via INTRAVENOUS

## 2016-01-05 MED ORDER — SERTRALINE HCL 50 MG PO TABS
100.0000 mg | ORAL_TABLET | Freq: Every day | ORAL | Status: DC
  Administered 2016-01-06 – 2016-01-10 (×5): 100 mg via ORAL
  Filled 2016-01-05 (×5): qty 2

## 2016-01-05 MED ORDER — MEMANTINE HCL 10 MG PO TABS
10.0000 mg | ORAL_TABLET | Freq: Every day | ORAL | Status: DC
  Administered 2016-01-06 – 2016-01-10 (×5): 10 mg via ORAL
  Filled 2016-01-05 (×5): qty 1

## 2016-01-05 MED ORDER — RISPERIDONE 0.25 MG PO TABS
0.5000 mg | ORAL_TABLET | Freq: Every day | ORAL | Status: DC
  Administered 2016-01-06 – 2016-01-10 (×5): 0.5 mg via ORAL
  Filled 2016-01-05 (×5): qty 2

## 2016-01-05 MED ORDER — DEXTROSE-NACL 5-0.45 % IV SOLN
INTRAVENOUS | Status: DC
  Administered 2016-01-05 – 2016-01-09 (×7): via INTRAVENOUS

## 2016-01-05 MED ORDER — DEXTROSE 5 % IV SOLN
1.0000 g | Freq: Once | INTRAVENOUS | Status: AC
  Administered 2016-01-06: 1 g via INTRAVENOUS
  Filled 2016-01-05: qty 10

## 2016-01-05 MED ORDER — ASPIRIN EC 81 MG PO TBEC
81.0000 mg | DELAYED_RELEASE_TABLET | Freq: Every day | ORAL | Status: DC
  Administered 2016-01-05 – 2016-01-06 (×2): 81 mg via ORAL
  Filled 2016-01-05 (×4): qty 1

## 2016-01-05 MED ORDER — ATORVASTATIN CALCIUM 10 MG PO TABS
20.0000 mg | ORAL_TABLET | Freq: Every day | ORAL | Status: DC
  Administered 2016-01-05 – 2016-01-09 (×4): 20 mg via ORAL
  Filled 2016-01-05 (×5): qty 2

## 2016-01-05 MED ORDER — HEPARIN SODIUM (PORCINE) 5000 UNIT/ML IJ SOLN
5000.0000 [IU] | Freq: Three times a day (TID) | INTRAMUSCULAR | Status: DC
  Administered 2016-01-05 – 2016-01-10 (×13): 5000 [IU] via SUBCUTANEOUS
  Filled 2016-01-05 (×13): qty 1

## 2016-01-05 MED ORDER — ACETAMINOPHEN 325 MG PO TABS: 650.0000 mg | ORAL_TABLET | Freq: Four times a day (QID) | ORAL | Status: DC | PRN

## 2016-01-05 MED ORDER — GUAIFENESIN 100 MG/5ML PO SYRP
200.0000 mg | ORAL_SOLUTION | Freq: Four times a day (QID) | ORAL | Status: DC | PRN
  Filled 2016-01-05: qty 10

## 2016-01-05 MED ORDER — LOPERAMIDE HCL 2 MG PO CAPS
2.0000 mg | ORAL_CAPSULE | ORAL | Status: DC | PRN
Start: 2016-01-05 — End: 2016-01-10

## 2016-01-05 MED ORDER — OMEGA-3-ACID ETHYL ESTERS 1 G PO CAPS
1.0000 g | ORAL_CAPSULE | Freq: Every day | ORAL | Status: DC
  Administered 2016-01-05 – 2016-01-10 (×4): 1 g via ORAL
  Filled 2016-01-05 (×7): qty 1

## 2016-01-05 MED ORDER — ACETAMINOPHEN 650 MG RE SUPP: 650.0000 mg | Freq: Four times a day (QID) | RECTAL | Status: DC | PRN

## 2016-01-05 MED ORDER — DONEPEZIL HCL 10 MG PO TABS
20.0000 mg | ORAL_TABLET | Freq: Every day | ORAL | Status: DC
  Administered 2016-01-06 – 2016-01-10 (×5): 20 mg via ORAL
  Filled 2016-01-05 (×5): qty 2

## 2016-01-05 MED ORDER — BIOTIN 5 MG PO TABS: 5.0000 mg | ORAL_TABLET | Freq: Every day | ORAL | Status: DC

## 2016-01-05 NOTE — ED Notes (Signed)
Bed: [id number] Expected date: 01/05/16 Expected time:  Means of arrival:  Comments: HALL C-NEEDS IN&OUT

## 2016-01-05 NOTE — ED Provider Notes (Signed)
The patient is a 70 year old female, she is currently in a memory care facility but over the last 3 weeks has had a progressive generalized weakness and has had several falls out of her wheelchair, presents today because of worsening weakness. On exam she has no signs of head trauma, she is altered, confused, withdrawn but in no distress. She has clear heart and lung sounds, soft abdomen, no edema, no murmurs. Lab workup shows that she does have a significant hypernatremia and hyperchloremia as well as an acute kidney injury and uremia. This could all be related to dehydration, also consider urinary tract infection or medication reaction causing hypernatremia.  patient is to have some half normal saline and be admitted to the hospital. I discussed this with the family members at the bedside and they are in agreement  D/w hospitalist who will admit  Has AKI, UTI and hyper Na  Medical screening examination/treatment/procedure(s) were conducted as a shared visit with non-physician practitioner(s) and myself.  I personally evaluated the patient during the encounter.  Clinical Impression:   Final diagnoses:  Hypernatremia  Acute cystitis without hematuria         Eber HongBrian Kamrin Spath, MD 01/07/16 1122

## 2016-01-05 NOTE — ED Notes (Signed)
Patient makes multiple attempts to D/C IV. EDP to be notified. Per family at bedside. Pt normally has to be restrained because she will take it out.

## 2016-01-05 NOTE — ED Notes (Signed)
Patient has arms folded and will not interact much with staff. Pt will open eyes and allow me to check her extremities. Pt does become resistant.

## 2016-01-05 NOTE — ED Notes (Signed)
Brother also at bedside to aid in assessment.

## 2016-01-05 NOTE — Progress Notes (Addendum)
Patient is pulling at lines, physically aggressive, and attempting to get out of bed. Patient is a high fall risk. Redirection is unsuccessful. Attending Provider and Charge Nurse notified. An order for Safety Ophelia CharterSitter is initiated. No Safety Sitter available at this time. Will continue to monitor patient. PRN medication given for agitation and anxiety. Patient's SNF telephoned requesting urine lab results be faxed to them when available.

## 2016-01-05 NOTE — ED Notes (Signed)
Pts Brother states that has dementia and only says his name. Lately pt has become more lethargic at the nursing home. He states it may be due to some medication she is on or that she may have a UTI. PA has been informed.

## 2016-01-05 NOTE — ED Provider Notes (Signed)
[facility]-EMERGENCY DEPT Provider Note   CSN: [id number] Arrival date & time: 01/05/16  8295     History   Chief Complaint Chief Complaint  Patient presents with  . Fall    HPI  Pt is nonverbal, all history provided by Glenda Coleman (brother).    Glenda Coleman is a 70 y.o. female resident of [facility] with extensive pmh including advanced dementia, CVA, hypertension, t2dm who presents after being found on the floor by nursing facility staff s/p unwitnessed fall.  Pt was found at 9:20am, unsure of how long pt was down. Pt has had 3 falls in the last 2 weeks, prior to this she was ambulating on her own with a rolling walker, she now gets pushed on a wheelchair.  Pt's brother unsure if pt has had recent fevers, cough, abdominal pain, urinary symptoms, N/V/D/C.  Pt's brother has noticed pt is more somnolent, drowsy and less alert starting 3 weeks ago, he thinks this is caused by one of pt's medications.  Pt's brother unable to tell me if there have been any recent medication changes to pt's home meds.  No h/o anticoagulation.  HPI  Past Medical History:  Diagnosis Date  . Alzheimer disease   . Arthritis    "back" (08/27/2015)  . Chronic lower back pain   . Dementia    "severe" (08/27/2015)  . Depression   . Hypertension   . Stroke (HCC) 08/27/2015  . TIA (transient ischemic attack) dx'd 2015   "dr said she'd had a few; put her on aspirin at that point"  . Type II diabetes mellitus [facility]     Patient Active Problem List   Diagnosis Date Noted  . DNR (do not resuscitate)   . Palliative care encounter   . Goals of care, counseling/discussion   . Acute CVA (cerebrovascular accident) (HCC) 08/28/2015  . Stroke (cerebrum) (HCC)   . Dysphagia, pharyngoesophageal phase   . Essential hypertension   . HLD (hyperlipidemia)   . Dementia   . CVA (cerebral vascular accident) (HCC) 08/27/2015    Past Surgical History:  Procedure Laterality Date  . ABDOMINAL HYSTERECTOMY     . PARTIAL KNEE ARTHROPLASTY Left     OB History    No data available       Home Medications    Prior to Admission medications   Medication Sig Start Date End Date Taking? Authorizing Provider  acetaminophen (TYLENOL) 500 MG tablet Take 500 mg by mouth every 4 (four) hours as needed for mild pain, moderate pain, fever or headache.     Historical Provider, MD  alum & mag hydroxide-simeth (MINTOX) 200-200-20 MG/5ML suspension Take 30 mLs by mouth as needed for indigestion or heartburn.    Historical Provider, MD  amoxicillin (AMOXIL) 500 MG capsule Take 2,000 mg by mouth See admin instructions. Take 4 capsules prior to dental appts    Historical Provider, MD  aspirin EC 81 MG EC tablet Take 1 tablet (81 mg total) by mouth daily. Patient taking differently: Take 81 mg by mouth daily with breakfast.  09/02/15   Fuller Plan, MD  atorvastatin (LIPITOR) 20 MG tablet Take 20 mg by mouth at bedtime.    Historical Provider, MD  Biotin 5 MG TABS Take 5 mg by mouth daily with breakfast.     Historical Provider, MD  calcium-vitamin D (OSCAL WITH D) 500-200 MG-UNIT tablet Take 1 tablet by mouth 2 (two) times daily.    Historical Provider, MD  divalproex (DEPAKOTE SPRINKLE)  125 MG capsule Take 250 mg by mouth 2 (two) times daily.    Historical Provider, MD  donepezil (ARICEPT) 10 MG tablet Take 20 mg by mouth daily with breakfast.  09/24/14   Historical Provider, MD  guaifenesin (ROBAFEN) 100 MG/5ML syrup Take 200 mg by mouth every 6 (six) hours as needed for cough.    Historical Provider, MD  hydrochlorothiazide (HYDRODIURIL) 25 MG tablet Take 25 mg by mouth daily with breakfast.  08/28/14   Historical Provider, MD  lisinopril (PRINIVIL,ZESTRIL) 5 MG tablet Take 5 mg by mouth daily with breakfast.  09/08/14   Historical Provider, MD  loperamide (IMODIUM) 2 MG capsule Take 2 mg by mouth as needed for diarrhea or loose stools.    Historical Provider, MD  LORazepam (ATIVAN) 0.5 MG tablet Take 0.5 mg  by mouth 2 (two) times daily.     Historical Provider, MD  magnesium hydroxide (MILK OF MAGNESIA) 400 MG/5ML suspension Take 30 mLs by mouth at bedtime as needed for mild constipation.    Historical Provider, MD  memantine (NAMENDA) 10 MG tablet Take 10 mg by mouth daily with breakfast.  09/30/14   Historical Provider, MD  Multiple Vitamin (DAILY VITE PO) Take 1 tablet by mouth daily with breakfast.     Historical Provider, MD  naproxen (NAPROSYN) 500 MG tablet Take 500 mg by mouth 2 (two) times daily with a meal.    Historical Provider, MD  neomycin-bacitracin-polymyxin (NEOSPORIN) ointment Apply 1 application topically as needed for wound care.    Historical Provider, MD  Omega-3 Fatty Acids (FISH OIL) 1000 MG CAPS Take 2,000 mg by mouth daily with breakfast.     Historical Provider, MD  PRESCRIPTION MEDICATION Apply 0.5 mLs topically every 8 (eight) hours as needed (for agitation). Apply to wrist every 8 hours as needed for agitation    Historical Provider, MD  risperiDONE (RISPERDAL) 1 MG tablet Take 1 tablet (1 mg total) by mouth as directed. Take 0.5mg  qAM and 1mg  qHS Patient taking differently: Take 0.5-1 mg by mouth 2 (two) times daily. Takes 0.5mg  in the morning and 1mg  at bedtime 09/02/15   Fuller Planhristopher W Rice, MD  sertraline (ZOLOFT) 100 MG tablet Take 100 mg by mouth daily with breakfast.     Historical Provider, MD    Family History No family history on file.  Social History Social History  Substance Use Topics  . Smoking status: Former Smoker    Years: 25.00    Types: Cigarettes  . Smokeless tobacco: Former NeurosurgeonUser    Types: Chew     Comment: "stoped smoking cigarettes & chewng in the 1980s"  . Alcohol use Yes     Comment: 08/27/2015 "quit in the 1970s"     Allergies   Review of patient's allergies indicates no known allergies.   Review of Systems Review of Systems  Constitutional: Positive for activity change (somnolence ).  HENT: Negative.   Eyes: Negative.     Respiratory: Negative for cough and wheezing.   Cardiovascular: Negative for chest pain and leg swelling.  Gastrointestinal: Negative for abdominal pain, constipation, diarrhea and vomiting.  Skin: Negative.   Allergic/Immunologic: Negative.   Neurological:       Advanced dementia, nonverbal  Hematological: Negative.   Psychiatric/Behavioral:       Increased somnolence, confusion x 3 weeks per pt's brother     Physical Exam Updated Vital Signs There were no vitals taken for this visit.  Physical Exam  Constitutional: She appears well-developed.  Somnolent,  difficult to arouse, nonverbal, uncooperative during exam  HENT:  Head: Normocephalic and atraumatic.  Dry mucous membranes  Eyes: Conjunctivae are normal. Pupils are equal, round, and reactive to light.  Neck: Neck supple.  Cardiovascular: Normal rate, regular rhythm, normal heart sounds and intact distal pulses.   Pulmonary/Chest: Effort normal and breath sounds normal.  Abdominal: Soft. Bowel sounds are normal. She exhibits no mass.  Lymphadenopathy:    She has no cervical adenopathy.  Neurological:  Advanced dementia, nonverbal  Skin: Skin is warm and dry.  Psychiatric:  Advanced dementia, nonverbal     ED Treatments / Results  Labs (all labs ordered are listed, but only abnormal results are displayed) Labs Reviewed  COMPREHENSIVE METABOLIC PANEL - Abnormal; Notable for the following:       Result Value   Sodium 159 (*)    Chloride 120 (*)    Glucose, Bld 116 (*)    BUN 70 (*)    Creatinine, Ser 1.61 (*)    Total Protein 8.2 (*)    GFR calc non Af Amer 31 (*)    GFR calc Af Amer 36 (*)    All other components within normal limits  URINALYSIS, ROUTINE W REFLEX MICROSCOPIC (NOT AT [facility]) - Abnormal; Notable for the following:    APPearance TURBID (*)    Hgb urine dipstick MODERATE (*)    Nitrite POSITIVE (*)    Leukocytes, UA LARGE (*)    All other components within normal limits  URINE MICROSCOPIC-ADD  ON - Abnormal; Notable for the following:    Squamous Epithelial / LPF 0-5 (*)    Bacteria, UA MANY (*)    All other components within normal limits  URINE CULTURE  CBC WITH DIFFERENTIAL/PLATELET    EKG  EKG Interpretation  Date/Time:  Wednesday January 05 2016 13:19:14 EDT Ventricular Rate:  65 PR Interval:    QRS Duration: 109 QT Interval:  439 QTC Calculation: 453 R Axis:   9 Text Interpretation:  Sinus rhythm Consider left atrial enlargement Abnormal R-wave progression, early transition Nonspecific repol abnormality, diffuse leads Artifact in lead(s) I II III aVR aVL aVF V1 V2 V3 V4 and baseline wander in lead(s) V6 since last tracing no significant change Confirmed by Hyacinth Meeker  MD, BRIAN ([id number]) on 01/05/2016 2:25:16 PM       Radiology Ct Head Wo Contrast  Result Date: 01/05/2016 CLINICAL DATA:  Unwitnessed fall, history hypertension, type II diabetes mellitus, stroke, TIA, Alzheimer's, former smoker EXAM: CT HEAD WITHOUT CONTRAST TECHNIQUE: Contiguous axial images were obtained from the base of the skull through the vertex without intravenous contrast. Scattered motion artifacts despite repeat imaging. COMPARISON:  12/30/2015 FINDINGS: Brain: Assessment limited by motion artifacts. Generalized atrophy. Diffuse dilatation of the ventricular system appears grossly stable. Small vessel chronic ischemic changes of deep cerebral white matter. Old LEFT temporoparietal infarct. No gross evidence of hemorrhage or infarction identified on limited exam. Vascular: Grossly unremarkable Skull: Limits assessment, no gross abnormality seen Sinuses/Orbits: Limited assessment, no gross abnormality seen Other: N/A IMPRESSION: Significant limitations of exam secondary to patient motion despite repeating images. Atrophy with small vessel chronic ischemic changes of deep cerebral white matter. Old LEFT temporoparietal infarct. No gross acute abnormalities identified within limitations of study.  Electronically Signed   By: Ulyses Southward M.D.   On: 01/05/2016 12:48    Procedures Procedures (including critical care time)  Medications Ordered in ED Medications  sodium chloride 0.45 % bolus 500 mL (500 mLs Intravenous New Bag/Given 01/05/16 1432)  0.9 %  sodium chloride infusion ( Intravenous New Bag/Given 01/05/16 1432)  cefTRIAXone (ROCEPHIN) 1 g in dextrose 5 % 50 mL IVPB (1 g Intravenous New Bag/Given 01/05/16 1452)  haloperidol lactate (HALDOL) injection 5 mg (5 mg Intravenous Given 01/05/16 1452)     Initial Impression / Assessment and Plan / ED Course  I have reviewed the triage vital signs and the nursing notes.  Pertinent labs & imaging results that were available during my care of the patient were reviewed by me and considered in my medical decision making (see chart for details).  Clinical Course    70 y.o female with complex pmh here s/p unwitnessed fall.  Nonverbal, all history provided by brother who is a poor historian as he does not live with her.  Potential causes for fall include vasovagal, arrhythmia, dehydration, stroke , electrolyte abnormalities or infection causing increased AMS.  EKG, CT head, CBC negative.  +U/A c/w UTI. +CMP with hypernatremia and acute kidney injury.  Acute kidney injury could be due to UTI/dehydration.  Pt is strong and combative and has h/o physical restraints in nursing facility.  Will give haldol to facilitate nurse care per rn request.  Will admit for hypernatremia, UTI, acute kidney injury and dehydration.  Final Clinical Impressions(s) / ED Diagnoses   Final diagnoses:  None    New Prescriptions New Prescriptions   No medications on file     Liberty Handy, PA-C 01/05/16 1519    Eber Hong, MD 01/07/16 1122

## 2016-01-05 NOTE — Progress Notes (Signed)
PHARMACIST - PHYSICIAN ORDER COMMUNICATION  CONCERNING: P&T Medication Policy on Herbal Medications  DESCRIPTION:  This patient's order for:  Biotin has been noted.  This product(s) is classified as an "herbal" or natural product. Due to a lack of definitive safety studies or FDA approval, nonstandard manufacturing practices, plus the potential risk of unknown drug-drug interactions while on inpatient medications, the Pharmacy and Therapeutics Committee does not permit the use of "herbal" or natural products of this type within [facility].   ACTION TAKEN: The pharmacy department is unable to verify this order at this time and your patient has been informed of this safety policy. Please reevaluate patient's clinical condition at discharge and address if the herbal or natural product(s) should be resumed at that time.  Dorna LeitzAnh Verlin Uher, PharmD, BCPS 01/05/2016 4:12 PM

## 2016-01-05 NOTE — H&P (Signed)
[facility] H&P   Patient Demographics:    Glenda Coleman, is a 70 y.o. female  MRN: [id number]   DOB - 12-Dec-1945  Admit Date - 01/05/2016  Outpatient Primary MD for the patient is Elinor Dodge, MD  Referring MD/NP/PA: Dr Hyacinth Meeker  Patient coming from: SNF  Chief Complaint  Patient presents with  . Fall      HPI:    Glenda Coleman  is a 71 y.o. female, Significant for Alzheimer's dementia, hypertension, CVA, hyperlipidemia, who is his SNF resident, patient is currently nonverbal, no family at bedside, history was obtained from medical records in ED physician, patient is a resident in memory care facility, been having progressive weakness over last 3 weeks, where she had several falls from her wheelchair, so family brought her for further evaluation. - IN ED patient is afebrile, no leukocytosis, but workup significant for hyponatremia with sodium 159, UTI, and history of present illness with creatinine of 1.6, CT head with no acute findings, patient started on D5 half-normal saline, IV Rocephin, and hospitalist requested to admit    Review of systems:    Patient is nonverbal, can't provide any review of system   With Past History of the following :    Past Medical History:  Diagnosis Date  . Alzheimer disease   . Arthritis    "back" (08/27/2015)  . Chronic lower back pain   . Dementia    "severe" (08/27/2015)  . Depression   . Hypertension   . Stroke (HCC) 08/27/2015  . TIA (transient ischemic attack) dx'd 2015   "dr said she'd had a few; put her on aspirin at that point"  . Type II diabetes mellitus (HCC)       Past Surgical History:  Procedure Laterality Date  . ABDOMINAL HYSTERECTOMY    . PARTIAL KNEE ARTHROPLASTY Left       Social History:     Social History  Substance Use Topics  . Smoking status: Former Smoker    Years: 25.00    Types: Cigarettes   . Smokeless tobacco: Former Neurosurgeon    Types: Chew     Comment: "stoped smoking cigarettes & chewng in the 1980s"  . Alcohol use Yes     Comment: 08/27/2015 "quit in the 1970s"     Lives - At SNF  Mobility - unknown     Family History :   Tried to obtain, but patient is nonverbal, could not get in touch with family members   Home Medications:   Prior to Admission medications   Medication Sig Start Date End Date Taking? Authorizing Provider  acetaminophen (TYLENOL) 500 MG tablet Take 500 mg by mouth every 4 (four) hours as needed for mild pain, moderate pain, fever or headache.    Yes Historical Provider, MD  alum & mag hydroxide-simeth (MINTOX) 200-200-20 MG/5ML suspension Take 30 mLs by mouth as needed for indigestion  or heartburn.   Yes Historical Provider, MD  amoxicillin (AMOXIL) 500 MG capsule Take 2,000 mg by mouth See admin instructions. Take 4 capsules prior to dental appts   Yes Historical Provider, MD  aspirin EC 81 MG EC tablet Take 1 tablet (81 mg total) by mouth daily. Patient taking differently: Take 81 mg by mouth daily with breakfast.  09/02/15  Yes Fuller Plan, MD  atorvastatin (LIPITOR) 20 MG tablet Take 20 mg by mouth at bedtime.   Yes Historical Provider, MD  Biotin 5 MG TABS Take 5 mg by mouth daily with breakfast.    Yes Historical Provider, MD  calcium-vitamin D (OSCAL WITH D) 500-200 MG-UNIT tablet Take 1 tablet by mouth 2 (two) times daily.   Yes Historical Provider, MD  divalproex (DEPAKOTE SPRINKLE) 125 MG capsule Take 250 mg by mouth 2 (two) times daily.   Yes Historical Provider, MD  donepezil (ARICEPT) 10 MG tablet Take 20 mg by mouth daily with breakfast.  09/24/14  Yes Historical Provider, MD  guaifenesin (ROBAFEN) 100 MG/5ML syrup Take 200 mg by mouth every 6 (six) hours as needed for cough.   Yes Historical Provider, MD  hydrochlorothiazide (HYDRODIURIL) 25 MG tablet Take 25 mg by mouth daily with breakfast.  08/28/14  Yes Historical Provider, MD    lisinopril (PRINIVIL,ZESTRIL) 5 MG tablet Take 5 mg by mouth daily with breakfast.  09/08/14  Yes Historical Provider, MD  loperamide (IMODIUM) 2 MG capsule Take 2 mg by mouth as needed for diarrhea or loose stools.   Yes Historical Provider, MD  memantine (NAMENDA) 10 MG tablet Take 10 mg by mouth daily with breakfast.  09/30/14  Yes Historical Provider, MD  Multiple Vitamin (DAILY VITE PO) Take 1 tablet by mouth daily with breakfast.    Yes Historical Provider, MD  naproxen (NAPROSYN) 500 MG tablet Take 500 mg by mouth 2 (two) times daily with a meal.   Yes Historical Provider, MD  neomycin-bacitracin-polymyxin (NEOSPORIN) ointment Apply 1 application topically as needed for wound care.   Yes Historical Provider, MD  Omega-3 Fatty Acids (FISH OIL) 1000 MG CAPS Take 2,000 mg by mouth daily with breakfast.    Yes Historical Provider, MD  PRESCRIPTION MEDICATION Apply 0.5 mLs topically every 8 (eight) hours as needed (for agitation). Apply to wrist every 8 hours as needed for agitation --- R- Lorazepam PLO Gel 1mg /ml   Yes Historical Provider, MD  risperiDONE (RISPERDAL) 1 MG tablet Take 1 tablet (1 mg total) by mouth as directed. Take 0.5mg  qAM and 1mg  qHS Patient taking differently: Take 0.5-1 mg by mouth 2 (two) times daily. Takes 0.5mg  in the morning and 1mg  at bedtime 09/02/15  Yes Fuller Plan, MD  sertraline (ZOLOFT) 100 MG tablet Take 100 mg by mouth daily with breakfast.    Yes Historical Provider, MD     Allergies:    No Known Allergies   Physical Exam:   Vitals  Blood pressure (!) 146/82, pulse 72, temperature 97.8 F (36.6 C), temperature source Oral, resp. rate 18, weight 77.1 kg (169 lb 15.6 oz), SpO2 99 %.   1. General Frail elderly female lying in bed in NAD,    2. Patient appears with significant dementia, nonverbal, and answered not answering any questions  3. Could not perform neurological exam secondary to patient noncompliance, but moving all extremities without  significant deficits  4. Ears and Eyes appear Normal, Conjunctivae clear, PERRLA.Dry Oral Mucosa.  5. Supple Neck, No JVD, No cervical lymphadenopathy  appriciated, No Carotid Bruits.  6. Symmetrical Chest wall movement, Good air movement bilaterally, CTAB.  7. RRR, No Gallops, Rubs or Murmurs, No Parasternal Heave.  8. Positive Bowel Sounds, Abdomen Soft, No tenderness, No organomegaly appriciated,No rebound -guarding or rigidity.  9.  No Cyanosis, delayed Skin Turgor, No Skin Rash or Bruise.  10. Good muscle tone,  joints appear normal , no effusions,  11. No Palpable Lymph Nodes in Neck or Axillae    Data Review:    CBC  Recent Labs Lab 01/05/16 1252  WBC 7.3  HGB 13.6  HCT 42.9  PLT 296  MCV 89.0  MCH 28.2  MCHC 31.7  RDW 14.3  LYMPHSABS 2.2  MONOABS 0.7  EOSABS 0.0  BASOSABS 0.0   ------------------------------------------------------------------------------------------------------------------  Chemistries   Recent Labs Lab 01/05/16 1252  NA 159*  K 3.8  CL 120*  CO2 29  GLUCOSE 116*  BUN 70*  CREATININE 1.61*  CALCIUM 10.0  AST 18  ALT 21  ALKPHOS 58  BILITOT 0.6   ------------------------------------------------------------------------------------------------------------------ estimated creatinine clearance is 34.5 mL/min (by C-G formula based on SCr of 1.61 mg/dL (H)). ------------------------------------------------------------------------------------------------------------------ No results for input(s): TSH, T4TOTAL, T3FREE, THYROIDAB in the last 72 hours.  Invalid input(s): FREET3  Coagulation profile No results for input(s): INR, PROTIME in the last 168 hours. ------------------------------------------------------------------------------------------------------------------- No results for input(s): DDIMER in the last 72  hours. -------------------------------------------------------------------------------------------------------------------  Cardiac Enzymes No results for input(s): CKMB, TROPONINI, MYOGLOBIN in the last 168 hours.  Invalid input(s): CK ------------------------------------------------------------------------------------------------------------------ No results found for: BNP   ---------------------------------------------------------------------------------------------------------------  Urinalysis    Component Value Date/Time   COLORURINE YELLOW 01/05/2016 1151   APPEARANCEUR TURBID (A) 01/05/2016 1151   LABSPEC 1.024 01/05/2016 1151   PHURINE 7.0 01/05/2016 1151   GLUCOSEU NEGATIVE 01/05/2016 1151   HGBUR MODERATE (A) 01/05/2016 1151   BILIRUBINUR NEGATIVE 01/05/2016 1151   KETONESUR NEGATIVE 01/05/2016 1151   PROTEINUR NEGATIVE 01/05/2016 1151   UROBILINOGEN 0.2 10/06/2014 1142   NITRITE POSITIVE (A) 01/05/2016 1151   LEUKOCYTESUR LARGE (A) 01/05/2016 1151    ----------------------------------------------------------------------------------------------------------------   Imaging Results:    Ct Head Wo Contrast  Result Date: 01/05/2016 CLINICAL DATA:  Unwitnessed fall, history hypertension, type II diabetes mellitus, stroke, TIA, Alzheimer's, former smoker EXAM: CT HEAD WITHOUT CONTRAST TECHNIQUE: Contiguous axial images were obtained from the base of the skull through the vertex without intravenous contrast. Scattered motion artifacts despite repeat imaging. COMPARISON:  12/30/2015 FINDINGS: Brain: Assessment limited by motion artifacts. Generalized atrophy. Diffuse dilatation of the ventricular system appears grossly stable. Small vessel chronic ischemic changes of deep cerebral white matter. Old LEFT temporoparietal infarct. No gross evidence of hemorrhage or infarction identified on limited exam. Vascular: Grossly unremarkable Skull: Limits assessment, no gross abnormality  seen Sinuses/Orbits: Limited assessment, no gross abnormality seen Other: N/A IMPRESSION: Significant limitations of exam secondary to patient motion despite repeating images. Atrophy with small vessel chronic ischemic changes of deep cerebral white matter. Old LEFT temporoparietal infarct. No gross acute abnormalities identified within limitations of study. Electronically Signed   By: Ulyses SouthwardMark  Boles M.D.   On: 01/05/2016 12:48    My personal review of EKG: Rhythm NSR, Rate  65 /min, QTc 453 , no Acute ST changes   Assessment & Plan:    Active Problems:   CVA (cerebral vascular accident) (HCC)   Dysphagia, pharyngoesophageal phase   Essential hypertension   HLD (hyperlipidemia)   Dementia   Encephalopathy   Hypernatremia   AKI (acute kidney injury) (HCC)  Acute encephalopathy - This is metabolic encephalopathy secondary to hypernatremia and UTI, with baseline dementia and CVA - CT head with no acute findings - Patient initially somnolent, currently is more agitated, will give when necessary Haldol, and will have a safety sitter at bedside  Hypernatremia - Secondary to volume depletion and poor oral intake, will start on D5 half-normal saline and monitor sodium closely to avoid rapid correction  UTI - Continue with IV Rocephin, follow on urine cultures  AKI - Creatinine is 1.6 on admission, continue to hold hydrochlorothiazide and lisinopril, continue with gentle hydration  Hypertension - Hold meds till patient is more stable  Dementia - Continue with home medication  History of CVA - Continue with aspirin and statin  Hyperlipidemia - Continue with statin  DVT Prophylaxis Heparin - SCDs   AM Labs Ordered, also please review Full Orders  Family Communication: Admission, patients condition and plan of care including tests being ordered , try to contact son with phone numbers in records, with no availability.  Code Status Full code ( as it did indicate from SNF records  patient to receive CPR)  Likely DC to  Back to SNF  Condition GUARDED    Consults called: None  Admission status:  inpatient  Time spent in minutes : 65 minutes   Jacquita Mulhearn M.D on 01/05/2016 at 5:10 PM  Between 7am to 7pm - Pager - [phone]. After 7pm go to [email or web address] - password [facility]  [facility] - Office  [phone]

## 2016-01-05 NOTE — ED Triage Notes (Signed)
[facility] with unwitnessed fall. No injuries noted, pt was combative with transport then consoled easily. Takes asa daily.   cbg 119 122/82 82 hr 97 ra

## 2016-01-05 NOTE — ED Notes (Signed)
PA at bedside.

## 2016-01-05 NOTE — ED Notes (Signed)
[facility] Expected date:  Expected time:  Means of arrival:  Comments: EMS- 70yo F, unwitnessed fall/no complaints/Hx of dementia

## 2016-01-06 DIAGNOSIS — G9340 Encephalopathy, unspecified: Secondary | ICD-10-CM

## 2016-01-06 DIAGNOSIS — R1314 Dysphagia, pharyngoesophageal phase: Secondary | ICD-10-CM

## 2016-01-06 DIAGNOSIS — N179 Acute kidney failure, unspecified: Secondary | ICD-10-CM

## 2016-01-06 LAB — BASIC METABOLIC PANEL
ANION GAP: 8 (ref 5–15)
BUN: 55 mg/dL — ABNORMAL HIGH (ref 6–20)
CALCIUM: 9.5 mg/dL (ref 8.9–10.3)
CO2: 27 mmol/L (ref 22–32)
Chloride: 121 mmol/L — ABNORMAL HIGH (ref 101–111)
Creatinine, Ser: 1.37 mg/dL — ABNORMAL HIGH (ref 0.44–1.00)
GFR, EST AFRICAN AMERICAN: 43 mL/min — AB (ref >60)
GFR, EST NON AFRICAN AMERICAN: 38 mL/min — AB (ref >60)
GLUCOSE: 145 mg/dL — AB (ref 65–99)
Potassium: 3.9 mmol/L (ref 3.5–5.1)
SODIUM: 156 mmol/L — AB (ref 135–145)

## 2016-01-06 LAB — CBC
HCT: 40.3 % (ref 36.0–46.0)
HEMOGLOBIN: 12 g/dL (ref 12.0–15.0)
MCH: 26.5 pg (ref 26.0–34.0)
MCHC: 29.8 g/dL — AB (ref 30.0–36.0)
MCV: 89 fL (ref 78.0–100.0)
Platelets: 182 10*3/uL (ref 150–400)
RBC: 4.53 MIL/uL (ref 3.87–5.11)
RDW: 14.5 % (ref 11.5–15.5)
WBC: 6.3 10*3/uL (ref 4.0–10.5)

## 2016-01-06 LAB — MRSA PCR SCREENING: MRSA BY PCR: NEGATIVE

## 2016-01-06 MED ORDER — DEXTROSE 5 % IV SOLN
1.0000 g | INTRAVENOUS | Status: DC
  Administered 2016-01-07 – 2016-01-08 (×2): 1 g via INTRAVENOUS
  Filled 2016-01-06 (×2): qty 10

## 2016-01-06 MED ORDER — HYDRALAZINE HCL 20 MG/ML IJ SOLN: 5.0000 mg | Freq: Four times a day (QID) | INTRAMUSCULAR | Status: DC | PRN

## 2016-01-06 NOTE — Progress Notes (Signed)
PROGRESS NOTE    Glenda Coleman  ZOX:[id number] DOB: 1946-02-24 DOA: 01/05/2016 PCP: Elinor Dodge, MD   Brief Narrative:   70 y.o. female, Significant for Alzheimer's dementia, hypertension, CVA, hyperlipidemia, who is his SNF resident, patient is currently nonverbal, no family at bedside, history was obtained from medical records in ED physician, patient is a resident in memory care facility, been having progressive weakness over last 3 weeks, where she had several falls from her wheelchair, so family brought her for further evaluation. Pt diagnosed with UTI.  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 Include barriers to DC in this tab.   Consultants:   None   Procedures: None   Antimicrobials: Place on Rocephin   Subjective: Patient is nonverbal gaze is in my direction and looks away.  Objective: Vitals:   01/05/16 1501 01/05/16 1605 01/05/16 1641 01/05/16 2144  BP: 142/84 (!) 146/82  (!) 160/78  Pulse: 68 72  84  Resp: 16 18  18   Temp:  97.8 F (36.6 C)  97.9 F (36.6 C)  TempSrc:  Oral  Axillary  SpO2: 97% 99%  97%  Weight:   77.1 kg (169 lb 15.6 oz)    Height:   5\' 5"  (1.651 m)     Intake/Output Summary (Last 24 hours) at 01/06/16 1639 Last data filed at 01/06/16 8119  Gross per 24 hour  Intake           1162.5 ml  Output                0 ml  Net           1162.5 ml   Filed Weights   01/05/16 1641  Weight: 77.1 kg (169 lb 15.6 oz)    Examination:  General exam: Appears calm and comfortable, in nad. Respiratory system: Clear to auscultation. Respiratory effort normal. Cardiovascular system: S1 & S2 heard, RRR. No JVD, murmurs, rubs, gallops or clicks. Gastrointestinal system: Abdomen is nondistended, soft and nontender. No organomegaly or masses felt. Normal bowel sounds heard. Central nervous system: Alert and awake, gazes at examiners general direction Extremities: no rashes on limited exam. + distal pulses Skin: No rashes, lesions or ulcers Psychiatry: unable to assess due to dementia    Data Reviewed: I have personally reviewed following labs and imaging studies  CBC:  Recent Labs Lab 01/05/16 1252 01/06/16 0600  WBC 7.3 6.3  NEUTROABS 4.4  --   HGB 13.6 12.0  HCT 42.9 40.3  MCV 89.0 89.0  PLT 296 182   Basic Metabolic Panel:  Recent Labs Lab 01/05/16 1252 01/05/16 2028 01/06/16 0600  NA 159* 156* 156*  K 3.8  --  3.9  CL  120*  --  121*  CO2 29  --  27  GLUCOSE 116*  --  145*  BUN 70*  --  55*  CREATININE 1.61*  --  1.37*  CALCIUM 10.0  --  9.5   GFR: Estimated Creatinine Clearance: 38.1 mL/min (by C-G formula based on SCr of 1.37 mg/dL (H)). Liver Function Tests:  Recent Labs Lab 01/05/16 1252  AST 18  ALT 21  ALKPHOS 58  BILITOT 0.6  PROT 8.2*  ALBUMIN 4.4   No results for input(s): LIPASE, AMYLASE in the last 168 hours. No results for input(s): AMMONIA in the last 168 hours. Coagulation Profile: No results for input(s): INR, PROTIME in the last 168 hours. Cardiac Enzymes: No results for input(s): CKTOTAL, CKMB, CKMBINDEX, TROPONINI in the last 168 hours. BNP (last 3 results) No  results for input(s): PROBNP in the last 8760 hours. HbA1C: No results for input(s): HGBA1C in the last 72 hours. CBG: No results for input(s): GLUCAP in the last 168 hours. Lipid Profile: No results for input(s): CHOL, HDL, LDLCALC, TRIG, CHOLHDL, LDLDIRECT in the last 72 hours. Thyroid Function Tests: No results for input(s): TSH, T4TOTAL, FREET4, T3FREE, THYROIDAB in the last 72 hours. Anemia Panel: No results for input(s): VITAMINB12, FOLATE, FERRITIN, TIBC, IRON, RETICCTPCT in the last 72 hours. Sepsis Labs: No results for input(s): PROCALCITON, LATICACIDVEN in the last 168 hours.  Recent Results (from the past 240 hour(s))  MRSA PCR Screening     Status: None   Collection Time: 01/06/16  3:13 AM  Result Value Ref Range Status   MRSA by PCR NEGATIVE NEGATIVE Final    Comment:        The GeneXpert MRSA Assay (FDA approved for NASAL specimens only), is one component of a comprehensive MRSA colonization surveillance program. It is not intended to diagnose MRSA infection nor to guide or monitor treatment for MRSA infections.          Radiology Studies: Ct Head Wo Contrast  Result Date: 01/05/2016 CLINICAL DATA:  Unwitnessed fall, history hypertension, type II diabetes mellitus, stroke, TIA, Alzheimer's, former smoker EXAM: CT HEAD WITHOUT CONTRAST TECHNIQUE: Contiguous axial images were obtained from the base of the skull through the vertex without intravenous contrast. Scattered motion artifacts despite repeat imaging. COMPARISON:  12/30/2015 FINDINGS: Brain: Assessment limited by motion artifacts. Generalized atrophy. Diffuse dilatation of the ventricular system appears grossly stable. Small vessel chronic ischemic changes of deep cerebral white matter. Old LEFT temporoparietal infarct. No gross evidence of hemorrhage or infarction identified on limited exam. Vascular: Grossly unremarkable Skull: Limits assessment, no gross abnormality seen Sinuses/Orbits: Limited  assessment, no gross abnormality seen Other: N/A IMPRESSION: Significant limitations of exam secondary to patient motion despite repeating images. Atrophy with small vessel chronic ischemic changes of deep cerebral white matter. Old LEFT temporoparietal infarct. No gross acute abnormalities identified within limitations of study. Electronically Signed   By: Ulyses SouthwardMark  Boles M.D.   On: 01/05/2016 12:48        Scheduled Meds: . aspirin EC  81 mg Oral Daily  . atorvastatin  20 mg Oral QHS  . calcium-vitamin D  1 tablet Oral BID  . divalproex  250 mg Oral BID  . donepezil  20 mg Oral Q breakfast  . heparin  5,000 Units Subcutaneous Q8H  . memantine  10 mg Oral Q breakfast  . omega-3 acid ethyl esters  1 g Oral Daily  . risperiDONE  0.5 mg Oral Daily  . risperiDONE  1 mg Oral  QHS  . sertraline  100 mg Oral Q breakfast   Continuous Infusions: . dextrose 5 % and 0.45% NaCl 75 mL/hr at 01/06/16 0537     LOS: 1 day   Time spent: > 35 minutes  Penny Pia, MD [facility] Pager [phone]  If 7PM-7AM, please contact night-coverage [email or web address] Password [facility] 01/06/2016, 4:39 PM

## 2016-01-07 MED ORDER — ASPIRIN EC 81 MG PO TBEC
81.0000 mg | DELAYED_RELEASE_TABLET | Freq: Every day | ORAL | Status: DC
  Administered 2016-01-07 – 2016-01-10 (×4): 81 mg via ORAL
  Filled 2016-01-07 (×4): qty 1

## 2016-01-07 NOTE — Clinical Social Work Note (Addendum)
Clinical Social Work Assessment  Patient Details  Name: Glenda Coleman MRN: [id number] Date of Birth: [birth year]  Date of referral:  01/07/16               Reason for consult:  Facility Placement                Permission sought to share information with:  Facility Medical sales representativeContact Representative, Family Supports Permission granted to share information::     Name::     Brother: Glenda KaufmannCalvin Coleman  [facility]  Relationship::  Brother;Son  Contact Information:  [phone]  Housing/Transportation Living arrangements for the past 2 months:  [facility] Source of Information:  Power of Physiological scientistAttorney (Brother) Patient Interpreter Needed:  None Criminal Activity/Legal Involvement Pertinent to Current Situation/Hospitalization:  No - Comment as needed Significant Relationships:  Adult Children, Siblings Lives with:  Facility Resident Do you feel safe going back to the place where you live?  Yes Need for family participation in patient care:  Yes (Comment)  Care giving concerns:  No concerns presented bu Brother/POA. He expects patient to return to facility at discharge.   Social Worker assessment / plan:  LCSWA spoke to pt. brother by phone. He reports the patient has dementia and is often confused and non-verbal. The patient is a resident of the memory care unit and uses a wheelchair at baseline. LCSWA will help assist with family with discharge planning back to ALF.  fl2 to be completed.  Family expects patient to transported by EMS.   Employment status:    Insurance information:  Medicare PT Recommendations:  Not assessed at this time Information / Referral to community resources:     Patient/Family's Response to care:  Family agreeable to care/ Pt. has been refusing her medications.   Patient/Family's Understanding of and Emotional Response to Diagnosis, Current Treatment, and Prognosis: [facility]  Emotional Assessment Appearance:  Appears older than stated age Attitude/Demeanor/Rapport:  Unresponsive (Confused/Dementia) Affect (typically observed):    Orientation:  Oriented to Self Alcohol / Substance use:  Not Applicable Psych involvement (Current and /or in the community):     Discharge Needs  Concerns to be addressed:  Discharge Planning Concerns Readmission within the last 30 days:  Yes Current discharge risk:  Dependent with Mobility Barriers to Discharge:  Continued Medical Work up   [employer] A Daneli Butkiewicz, LCSW 01/07/2016, 9:49 AM

## 2016-01-07 NOTE — Progress Notes (Signed)
PROGRESS NOTE    Glenda Coleman  RUE:[id number] DOB: 1945/03/31 DOA: 01/05/2016 PCP: Elinor Dodge, MD   Brief Narrative:   70 y.o. female, Significant for Alzheimer's dementia, hypertension, CVA, hyperlipidemia, who is his SNF resident, patient is currently nonverbal, no family at bedside, history was obtained from medical records in ED physician, patient is a resident in memory care facility, been having progressive weakness over last 3 weeks, where she had several falls from her wheelchair, so family brought her for further evaluation. Pt diagnosed with UTI.  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    AKI (acute kidney injury) (HCC) - most likely due to dehydration - improving with fluids - reassess next am.  DVT prophylaxis: Heparin Code Status: Full Family Communication:  Disposition Plan: (specify when and where you expect patient to be discharged). Include barriers to DC in this tab.   Consultants:   None   Procedures: None   Antimicrobials: Place on Rocephin   Subjective: Patient is nonverbal gaze is in my direction and looks away.  Objective: Vitals:   01/06/16 1726 01/06/16 2140 01/07/16 0536 01/07/16 1500  BP: 129/80 (!) 156/87 (!) 152/91 (!) 161/94  Pulse: 73 68 77 71  Resp: 18 18 18 20   Temp: 97.7 F (36.5 C) 97.7 F (36.5 C) 98 F (36.7 C) 97.8 F (36.6 C)    TempSrc: Axillary Axillary Axillary Axillary  SpO2: 100%  98% 100%  Weight:      Height:        Intake/Output Summary (Last 24 hours) at 01/07/16 1716 Last data filed at 01/07/16 1400  Gross per 24 hour  Intake              660 ml  Output                0 ml  Net              660 ml   Filed Weights   01/05/16 1641  Weight: 77.1 kg (169 lb 15.6 oz)    Examination:  General exam: Appears calm and comfortable, in nad. Respiratory system: Clear to auscultation. Respiratory effort normal. Cardiovascular system: S1 & S2 heard, RRR. No JVD, murmurs, rubs, gallops or clicks. Gastrointestinal system: Abdomen is nondistended, soft and nontender. No organomegaly or masses felt. Normal bowel sounds heard. Central nervous system: Alert and awake, gazes at examiners general direction Extremities: no rashes on limited exam. + distal pulses Skin: No rashes, lesions or ulcers Psychiatry: unable to assess due to dementia    Data Reviewed: I have personally reviewed following labs and imaging studies  CBC:  Recent Labs Lab 01/05/16 1252 01/06/16 0600  WBC 7.3 6.3  NEUTROABS 4.4  --   HGB 13.6 12.0  HCT 42.9 40.3  MCV 89.0 89.0  PLT 296 182   Basic Metabolic Panel:  Recent Labs Lab 01/05/16 1252 01/05/16 2028 01/06/16 0600  NA 159* 156* 156*  K 3.8  --  3.9  CL 120*  --  121*  CO2 29  --  27  GLUCOSE 116*  --  145*  BUN 70*  --  55*  CREATININE 1.61*  --  1.37*  CALCIUM 10.0  --  9.5   GFR: Estimated Creatinine Clearance: 38.1 mL/min (by C-G formula based on SCr of 1.37 mg/dL (H)). Liver Function Tests:  Recent Labs Lab 01/05/16 1252  AST 18  ALT 21  ALKPHOS 58  BILITOT 0.6  PROT 8.2*  ALBUMIN 4.4   No results for input(s): LIPASE, AMYLASE in the last 168 hours. No results for input(s): AMMONIA in the last 168 hours. Coagulation Profile: No results for input(s): INR, PROTIME in the last 168 hours. Cardiac Enzymes: No results for input(s): CKTOTAL, CKMB,  CKMBINDEX, TROPONINI in the last 168 hours. BNP (last 3 results) No results for input(s): PROBNP in the last 8760 hours. HbA1C: No results for input(s): HGBA1C in the last 72 hours. CBG: No results for input(s): GLUCAP in the last 168 hours. Lipid Profile: No results for input(s): CHOL, HDL, LDLCALC, TRIG, CHOLHDL, LDLDIRECT in the last 72 hours. Thyroid Function Tests: No results for input(s): TSH, T4TOTAL, FREET4, T3FREE, THYROIDAB in the last 72 hours. Anemia Panel: No results for input(s): VITAMINB12, FOLATE, FERRITIN, TIBC, IRON, RETICCTPCT in the last 72 hours. Sepsis Labs: No results for input(s): PROCALCITON, LATICACIDVEN in the last 168 hours.  Recent Results (from the past 240 hour(s))  Urine culture     Status: Abnormal (Preliminary result)   Collection Time: 01/05/16 11:51 AM  Result Value Ref Range Status   Specimen Description URINE, RANDOM  Final   Special Requests NONE  Final   Culture >=100,000 COLONIES/mL ESCHERICHIA COLI (A)  Final   Report Status PENDING  Incomplete  MRSA PCR Screening     Status: None   Collection Time: 01/06/16  3:13 AM  Result Value Ref Range Status   MRSA by PCR NEGATIVE NEGATIVE Final    Comment:        The GeneXpert MRSA Assay (FDA approved for NASAL specimens only), is one component of a comprehensive MRSA colonization surveillance program. It is not intended to diagnose MRSA infection nor to guide or monitor treatment for MRSA infections.          Radiology Studies: No results found.      Scheduled Meds: . aspirin EC  81 mg Oral Daily  . atorvastatin  20 mg Oral QHS  . calcium-vitamin D  1 tablet Oral BID  . cefTRIAXone (ROCEPHIN)  IV  1 g Intravenous Q24H  . divalproex  250 mg Oral BID  . donepezil  20 mg Oral Q breakfast  . heparin  5,000 Units Subcutaneous Q8H  . memantine  10 mg Oral Q breakfast  . omega-3 acid ethyl esters  1 g Oral Daily  . risperiDONE  0.5 mg Oral Daily  . risperiDONE  1 mg Oral QHS  .  sertraline  100 mg Oral Q breakfast   Continuous Infusions: . dextrose 5 % and 0.45% NaCl 75 mL/hr at 01/07/16 0854     LOS: 2 days   Time spent: > 35 minutes  Penny PiaVEGA, Calah Gershman, MD [facility] Pager [phone]  If 7PM-7AM, please contact night-coverage [email or web address] Password [email or web address] 01/07/2016, 5:16 PM

## 2016-01-07 NOTE — NC FL2 (Signed)
[place] MEDICAID FL2 LEVEL OF CARE SCREENING TOOL     IDENTIFICATION  Patient Name: Glenda Coleman Birthdate: Sep 10, 1945 Sex: female Admission Date (Current Location): 01/05/2016  [facility] and [place] Number:  Producer, television/film/video and Address:  [facility],  [address], [place] [address]      Provider Number: [id number]  Attending Physician Name and Address:  Penny Pia, MD  Relative Name and Phone Number:       Current Level of Care: Hospital Recommended Level of Care: ALF Secured Unit [facility]) Prior Approval Number:    Date Approved/Denied:   PASRR Number:    Discharge Plan:  (ALF-Memorycare Unit)    Current Diagnoses: Patient Active Problem List   Diagnosis Date Noted  . Encephalopathy 01/05/2016  . Hypernatremia 01/05/2016  . AKI (acute kidney injury) (HCC) 01/05/2016  . DNR (do not resuscitate)   . Palliative care encounter   . Goals of care, counseling/discussion   . Acute CVA (cerebrovascular accident) (HCC) 08/28/2015  . Stroke (cerebrum) (HCC)   . Dysphagia, pharyngoesophageal phase   . Essential hypertension   . HLD (hyperlipidemia)   . Dementia   . CVA (cerebral vascular accident) (HCC) 08/27/2015    Orientation RESPIRATION BLADDER Height & Weight     Self  Normal Incontinent Weight: 169 lb 15.6 oz (77.1 kg) Height:  5\' 5"  (165.1 cm)  BEHAVIORAL SYMPTOMS/MOOD NEUROLOGICAL BOWEL NUTRITION STATUS      Incontinent Diet Dysphagia 3  AMBULATORY STATUS COMMUNICATION OF NEEDS Skin   Limited Assistance  Non-Verbally Normal                       Personal Care Assistance Level of Assistance  Bathing, Feeding, Dressing Bathing Assistance: Limited assistance Feeding assistance: Limited assistance Dressing Assistance: Limited assistance     Functional Limitations Info  Sight, Hearing, Speech Sight Info: Adequate Hearing Info: Adequate Speech Info: Adequate    SPECIAL CARE FACTORS FREQUENCY   Speech     1x a week                  Contractures Contractures Info: Not present    Additional Factors Info  Code Status, Allergies Code Status Info: Full Code Allergies Info: No Known Allergies            Current Medications (01/07/2016):  This is the current hospital active medication list Current Facility-Administered Medications  Medication Dose Route Frequency Provider Last Rate Last Dose  . acetaminophen (TYLENOL) tablet 650 mg  650 mg Oral Q6H PRN Starleen Arms, MD       Or  . acetaminophen (TYLENOL) suppository 650 mg  650 mg Rectal Q6H PRN Starleen Arms, MD      . alum & mag hydroxide-simeth (MAALOX/MYLANTA) 200-200-20 MG/5ML suspension 30 mL  30 mL Oral PRN Starleen Arms, MD      . aspirin EC tablet 81 mg  81 mg Oral Daily Penny Pia, MD   81 mg at 01/07/16 1043  . atorvastatin (LIPITOR) tablet 20 mg  20 mg Oral QHS Starleen Arms, MD   20 mg at 01/05/16 2149  . calcium-vitamin D (OSCAL WITH D) 500-200 MG-UNIT per tablet 1 tablet  1 tablet Oral BID Starleen Arms, MD   1 tablet at 01/07/16 1043  . cefTRIAXone (ROCEPHIN) 1 g in dextrose 5 % 50 mL IVPB  1 g Intravenous Q24H Hessie Knows, [facility]      . dextrose 5 %-  0.45 % sodium chloride infusion   Intravenous Continuous Starleen Arms, MD 75 mL/hr at 01/07/16 0854    . divalproex (DEPAKOTE SPRINKLE) capsule 250 mg  250 mg Oral BID Starleen Arms, MD   250 mg at 01/07/16 1043  . donepezil (ARICEPT) tablet 20 mg  20 mg Oral Q breakfast Starleen Arms, MD   20 mg at 01/07/16 0849  . guaifenesin (ROBITUSSIN) 100 MG/5ML syrup 200 mg  200 mg Oral Q6H PRN Starleen Arms, MD      . haloperidol lactate (HALDOL) injection 1 mg  1 mg Intravenous Q6H PRN Starleen Arms, MD   1 mg at 01/05/16 1640  . heparin injection 5,000 Units  5,000 Units Subcutaneous Q8H Leana Roe Elgergawy, MD   5,000 Units at 01/06/16 2159  . hydrALAZINE (APRESOLINE) injection 5 mg  5 mg Intravenous Q6H PRN Penny Pia, MD      . loperamide  (IMODIUM) capsule 2 mg  2 mg Oral PRN Starleen Arms, MD      . memantine [facility]) tablet 10 mg  10 mg Oral Q breakfast Starleen Arms, MD   10 mg at 01/07/16 0849  . omega-3 acid ethyl esters (LOVAZA) capsule 1 g  1 g Oral Daily Starleen Arms, MD   1 g at 01/07/16 1000  . risperiDONE (RISPERDAL) tablet 0.5 mg  0.5 mg Oral Daily Starleen Arms, MD   0.5 mg at 01/07/16 1042  . risperiDONE (RISPERDAL) tablet 1 mg  1 mg Oral QHS Starleen Arms, MD   1 mg at 01/05/16 2151  . sertraline (ZOLOFT) tablet 100 mg  100 mg Oral Q breakfast Starleen Arms, MD   100 mg at 01/07/16 0848     Discharge Medications: START taking these medications   Details  cephALEXin (KEFLEX) 500 MG capsule Take 1 capsule (500 mg total) by mouth every 12 (twelve) hours. Qty: 4 capsule, Refills: 0          CONTINUE these medications which have NOT CHANGED   Details  acetaminophen (TYLENOL) 500 MG tablet Take 500 mg by mouth every 4 (four) hours as needed for mild pain, moderate pain, fever or headache.     alum & mag hydroxide-simeth (MINTOX) 200-200-20 MG/5ML suspension Take 30 mLs by mouth as needed for indigestion or heartburn.    aspirin EC 81 MG EC tablet Take 1 tablet (81 mg total) by mouth daily.    atorvastatin (LIPITOR) 20 MG tablet Take 20 mg by mouth at bedtime.    Biotin 5 MG TABS Take 5 mg by mouth daily with breakfast.     calcium-vitamin D (OSCAL WITH D) 500-200 MG-UNIT tablet Take 1 tablet by mouth 2 (two) times daily.    divalproex (DEPAKOTE SPRINKLE) 125 MG capsule Take 250 mg by mouth 2 (two) times daily.    donepezil (ARICEPT) 10 MG tablet Take 20 mg by mouth daily with breakfast.  Refills: 3    guaifenesin (ROBAFEN) 100 MG/5ML syrup Take 200 mg by mouth every 6 (six) hours as needed for cough.    lisinopril (PRINIVIL,ZESTRIL) 5 MG tablet Take 5 mg by mouth daily with breakfast.  Refills: 3    memantine (NAMENDA) 10 MG tablet Take 10 mg by mouth  daily with breakfast.  Refills: 6    Multiple Vitamin (DAILY VITE PO) Take 1 tablet by mouth daily with breakfast.     neomycin-bacitracin-polymyxin (NEOSPORIN) ointment Apply 1 application topically as needed for wound care.  Omega-3 Fatty Acids (FISH OIL) 1000 MG CAPS Take 2,000 mg by mouth daily with breakfast.     PRESCRIPTION MEDICATION Apply 0.5 mLs topically every 8 (eight) hours as needed (for agitation). Apply to wrist every 8 hours as needed for agitation --- R- Lorazepam PLO Gel 1mg /ml    risperiDONE (RISPERDAL) 1 MG tablet Take 1 tablet (1 mg total) by mouth as directed. Take 0.5mg  qAM and 1mg  qHS    sertraline (ZOLOFT) 100 MG tablet Take 100 mg by mouth daily with breakfast.       STOP taking these medications     amoxicillin (AMOXIL) 500 MG capsule      hydrochlorothiazide (HYDRODIURIL) 25 MG tablet      loperamide (IMODIUM) 2 MG capsule      naproxen (NAPROSYN) 500 MG tablet        No Known Allergies   Relevant Imaging Results:  Relevant Lab Results:   Additional Information SSN: [id number]  Clearance CootsNicole A Carmelina Balducci, [place]CS[place]

## 2016-01-07 NOTE — Evaluation (Signed)
Clinical/Bedside Swallow Evaluation Patient Details  Name: Glenda Coleman MRN: [id number] Date of Birth: 01-28-1946  Today's Date: 01/07/2016 Time: SLP Start Time (ACUTE ONLY): 0910 SLP Stop Time (ACUTE ONLY): 0946 SLP Time Calculation (min) (ACUTE ONLY): 36 min  Past Medical History:  Past Medical History:  Diagnosis Date  . Alzheimer disease   . Arthritis    "back" (08/27/2015)  . Chronic lower back pain   . Dementia    "severe" (08/27/2015)  . Depression   . Hypertension   . Stroke (HCC) 08/27/2015  . TIA (transient ischemic attack) dx'd 2015   "dr said she'd had a few; put her on aspirin at that point"  . Type II diabetes mellitus (HCC)    Past Surgical History:  Past Surgical History:  Procedure Laterality Date  . ABDOMINAL HYSTERECTOMY    . PARTIAL KNEE ARTHROPLASTY Left    HPI:  70 yo female adm to [facility] with AMS - diagnosed with encephalopathy.  CT head negative for gross changes.   Pt with PMH + for CVA, DM, fall, former smoker, dementia.  Pt resides at [facility].  Swallow evaluation ordered.    Assessment / Plan / Recommendation Clinical Impression  Pt presents with cognitive based oral deficits.  Delayed oral transiting/mastication up to 45 seconds but no residuals apparent.  Use of puree or liquids faciliates oral transiting of solids.  Skilled intervention included determining compensation strategies. Pt did not follow directions but was able to hold own cup/food to feed her self with encouragment.   No indications of airway compromise nor pharyngeal deficits apparent.  Pt is impulsive and takes large sequential boluses of liquids - therefore recommend full supervision.  She is a moderate aspiration risk due to her mentation/dementia, however mitigation strategies in place.   Hopeful for improvement with encephalopathy resolution.     Aspiration Risk  Mild aspiration risk;Moderate aspiration risk    Diet Recommendation Dysphagia 3 (Mech soft);Thin liquid   Liquid  Administration via: Cup;Straw Medication Administration: Crushed with puree (crush with icecream) Supervision: Full supervision/cueing for compensatory strategies Compensations: Minimize environmental distractions;Slow rate;Small sips/bites;Other (Comment) (use puree to aid oral transiting as needed) Postural Changes: Seated upright at 90 degrees;Remain upright for at least 30 minutes after po intake    Other  Recommendations Oral Care Recommendations: Oral care BID   Follow up Recommendations  (tbd)      Frequency and Duration min 1 x/week  1 week       Prognosis Prognosis for Safe Diet Advancement: Good      Swallow Study   General Date of Onset: 12/31/15 HPI: 70 yo female adm to [facility] with AMS - diagnosed with encephalopathy.  CT head negative for gross changes.   Pt with PMH + for CVA, DM, fall, former smoker, dementia.  Pt resides at [facility].  Swallow evaluation ordered.  Type of Study: Bedside Swallow Evaluation Diet Prior to this Study: Thin liquids;Dysphagia 1 (puree) Temperature Spikes Noted: No Respiratory Status: Room air History of Recent Intubation: No Behavior/Cognition: Lethargic/Drowsy;Requires cueing;Confused;Other (Comment) (would nod off frequently during evaluation but able to hold cup and food to feed herself) Oral Cavity Assessment: Other (comment) (could not view fully, pt did not follow directions) Oral Care Completed by SLP: No Oral Cavity - Dentition: Other (Comment) (dentition present, could not ascertain if natural or dentures) Vision: Functional for self-feeding Self-Feeding Abilities: Needs assist Patient Positioning: Upright in bed Baseline Vocal Quality: Normal Volitional Cough: Cognitively unable to elicit Volitional Swallow: Unable to elicit  Oral/Motor/Sensory Function Overall Oral Motor/Sensory Function: Within functional limits (pt without focal CN deficits from evaluation tasks she would complete)   Ice Chips Ice chips: Not tested   Thin  Liquid Thin Liquid: Within functional limits Presentation: Straw    Nectar Thick Nectar Thick Liquid: Within functional limits Presentation: Straw   Honey Thick Honey Thick Liquid: Not tested   Puree Puree: Impaired Presentation: Spoon Oral Phase Impairments: Reduced lingual movement/coordination Oral Phase Functional Implications: Prolonged oral transit   Solid   GO   Solid: Impaired Presentation: Self Fed Oral Phase Impairments: Reduced lingual movement/coordination;Impaired mastication Oral Phase Functional Implications: Prolonged oral transit        Donavan Burnetamara Georgean Spainhower, MS [facility] SLP [phone]

## 2016-01-08 LAB — BASIC METABOLIC PANEL
ANION GAP: 6 (ref 5–15)
BUN: 26 mg/dL — ABNORMAL HIGH (ref 6–20)
CALCIUM: 8.8 mg/dL — AB (ref 8.9–10.3)
CO2: 25 mmol/L (ref 22–32)
CREATININE: 1.05 mg/dL — AB (ref 0.44–1.00)
Chloride: 119 mmol/L — ABNORMAL HIGH (ref 101–111)
GFR, EST AFRICAN AMERICAN: 60 mL/min — AB (ref >60)
GFR, EST NON AFRICAN AMERICAN: 52 mL/min — AB (ref >60)
Glucose, Bld: 133 mg/dL — ABNORMAL HIGH (ref 65–99)
Potassium: 3.7 mmol/L (ref 3.5–5.1)
SODIUM: 150 mmol/L — AB (ref 135–145)

## 2016-01-08 LAB — URINE CULTURE: Culture: >=100000 — AB

## 2016-01-08 NOTE — Progress Notes (Signed)
PROGRESS NOTE    Glenda Coleman  JXB:[id number] DOB: 04-14-1945 DOA: 01/05/2016 PCP: Elinor DodgeFernando A Sanchez-Brugal, MD   Brief Narrative:   70 y.o. female, Significant for Alzheimer's dementia, hypertension, CVA, hyperlipidemia, who is his SNF resident, patient is currently nonverbal, no family at bedside, history was obtained from medical records in ED physician, patient is a resident in memory care facility, been having progressive weakness over last 3 weeks, where she had several falls from her wheelchair, so family brought her for further evaluation. Pt diagnosed with UTI.  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    AKI (acute kidney injury) (HCC) - most likely due to dehydration - improving with fluids - reassess next am.  DVT prophylaxis: Heparin Code Status: Full Family Communication: None Disposition Plan: With continued improvement in condition will consider discharging. Most likely next a.m.   Consultants:   None   Procedures: None   Antimicrobials: Place on Rocephin   Subjective: Patient is nonverbal gaze is in my direction and looks away.  Objective: Vitals:   01/07/16 1500 01/07/16 2215 01/08/16 0610 01/08/16 1422  BP: (!) 161/94 (!) 162/87 139/89 119/68  Pulse: 71 74 72 80  Resp: 20 20 20 18   Temp: 97.8 F (36.6 C) 97.8 F (36.6 C) 98.3 F (36.8 C) 97.9 F (36.6 C)  TempSrc:  Axillary Oral Oral Oral  SpO2: 100% 96% 98% 100%  Weight:      Height:        Intake/Output Summary (Last 24 hours) at 01/08/16 1628 Last data filed at 01/08/16 1300  Gross per 24 hour  Intake              100 ml  Output                0 ml  Net              100 ml   Filed Weights   01/05/16 1641  Weight: 77.1 kg (169 lb 15.6 oz)    Examination:  General exam: Appears calm and comfortable, in nad. Respiratory system: Clear to auscultation. Respiratory effort normal. Cardiovascular system: S1 & S2 heard, RRR. No JVD, murmurs, rubs, gallops or clicks. Gastrointestinal system: Abdomen is nondistended, soft and nontender. No organomegaly or masses felt. Normal bowel sounds heard. Central nervous system: Alert and awake, gazes at examiners general direction Extremities: no rashes on limited exam. + distal pulses Skin: No rashes, lesions or ulcers Psychiatry: unable to assess due to dementia    Data Reviewed: I have personally reviewed following labs and imaging studies  CBC:  Recent Labs Lab 01/05/16 1252 01/06/16 0600  WBC 7.3 6.3  NEUTROABS 4.4  --   HGB 13.6 12.0  HCT 42.9 40.3  MCV 89.0 89.0  PLT 296 182   Basic Metabolic Panel:  Recent Labs Lab 01/05/16 1252 01/05/16 2028 01/06/16 0600 01/08/16 0555  NA 159* 156* 156*  150*  K 3.8  --  3.9 3.7  CL 120*  --  121* 119*  CO2 29  --  27 25  GLUCOSE 116*  --  145* 133*  BUN 70*  --  55* 26*  CREATININE 1.61*  --  1.37* 1.05*  CALCIUM 10.0  --  9.5 8.8*   GFR: Estimated Creatinine Clearance: 49.7 mL/min (by C-G formula based on SCr of 1.05 mg/dL (H)). Liver Function Tests:  Recent Labs Lab 01/05/16 1252  AST 18  ALT 21  ALKPHOS 58  BILITOT 0.6  PROT 8.2*  ALBUMIN 4.4   No results for input(s): LIPASE, AMYLASE in the last 168 hours. No results for input(s): AMMONIA in the last 168 hours. Coagulation Profile: No results for input(s): INR, PROTIME in the last 168 hours. Cardiac Enzymes: No  results for input(s): CKTOTAL, CKMB, CKMBINDEX, TROPONINI in the last 168 hours. BNP (last 3 results) No results for input(s): PROBNP in the last 8760 hours. HbA1C: No results for input(s): HGBA1C in the last 72 hours. CBG: No results for input(s): GLUCAP in the last 168 hours. Lipid Profile: No results for input(s): CHOL, HDL, LDLCALC, TRIG, CHOLHDL, LDLDIRECT in the last 72 hours. Thyroid Function Tests: No results for input(s): TSH, T4TOTAL, FREET4, T3FREE, THYROIDAB in the last 72 hours. Anemia Panel: No results for input(s): VITAMINB12, FOLATE, FERRITIN, TIBC, IRON, RETICCTPCT in the last 72 hours. Sepsis Labs: No results for input(s): PROCALCITON, LATICACIDVEN in the last 168 hours.  Recent Results (from the past 240 hour(s))  Urine culture     Status: Abnormal   Collection Time: 01/05/16 11:51 AM  Result Value Ref Range Status   Specimen Description URINE, RANDOM  Final   Special Requests NONE  Final   Culture >=100,000 COLONIES/mL ESCHERICHIA COLI (A)  Final   Report Status 01/08/2016 FINAL  Final   Organism ID, Bacteria ESCHERICHIA COLI (A)  Final      Susceptibility   Escherichia coli - MIC*    AMPICILLIN 16 INTERMEDIATE Intermediate     CEFAZOLIN <=4 SENSITIVE Sensitive     CEFTRIAXONE <=1 SENSITIVE Sensitive     CIPROFLOXACIN <=0.25 SENSITIVE Sensitive     GENTAMICIN <=1 SENSITIVE Sensitive     IMIPENEM <=0.25 SENSITIVE Sensitive     NITROFURANTOIN <=16 SENSITIVE Sensitive     TRIMETH/SULFA <=20 SENSITIVE Sensitive     AMPICILLIN/SULBACTAM 4 SENSITIVE Sensitive     PIP/TAZO <=4 SENSITIVE Sensitive     Extended ESBL NEGATIVE Sensitive     * >=100,000 COLONIES/mL ESCHERICHIA COLI  MRSA PCR Screening     Status: None   Collection Time: 01/06/16  3:13 AM  Result Value Ref Range Status   MRSA by PCR NEGATIVE NEGATIVE Final    Comment:        The GeneXpert MRSA Assay (FDA approved for NASAL specimens only), is one component of a comprehensive MRSA  colonization surveillance program. It is not intended to diagnose MRSA infection nor to guide or monitor treatment for MRSA infections.          Radiology Studies: No results found.      Scheduled Meds: . aspirin EC  81 mg Oral Daily  . atorvastatin  20 mg Oral QHS  . calcium-vitamin D  1 tablet Oral BID  . cefTRIAXone (ROCEPHIN)  IV  1 g Intravenous Q24H  . divalproex  250 mg Oral BID  . donepezil  20 mg Oral Q breakfast  . heparin  5,000 Units Subcutaneous Q8H  .  memantine  10 mg Oral Q breakfast  . omega-3 acid ethyl esters  1 g Oral Daily  . risperiDONE  0.5 mg Oral Daily  . risperiDONE  1 mg Oral QHS  . sertraline  100 mg Oral Q breakfast   Continuous Infusions: . dextrose 5 % and 0.45% NaCl 75 mL/hr at 01/08/16 1144     LOS: 3 days   Time spent: > 35 minutes  Penny PiaVEGA, Tishina Lown, MD [facility] Pager [phone]  If 7PM-7AM, please contact night-coverage [email or web address] Password [email or web address] 01/08/2016, 4:28 PM

## 2016-01-09 DIAGNOSIS — I10 Essential (primary) hypertension: Secondary | ICD-10-CM

## 2016-01-09 MED ORDER — SODIUM CHLORIDE 0.9 % IV SOLN: INTRAVENOUS | Status: DC

## 2016-01-09 MED ORDER — SODIUM CHLORIDE 0.45 % IV SOLN
INTRAVENOUS | Status: DC
  Administered 2016-01-09 – 2016-01-10 (×2): via INTRAVENOUS

## 2016-01-09 MED ORDER — CEPHALEXIN 500 MG PO CAPS
500.0000 mg | ORAL_CAPSULE | Freq: Two times a day (BID) | ORAL | Status: DC
  Administered 2016-01-09 – 2016-01-10 (×3): 500 mg via ORAL
  Filled 2016-01-09 (×3): qty 1

## 2016-01-09 NOTE — Progress Notes (Signed)
PROGRESS NOTE    Devoria GlassingMary Coker  AVW:[id number] DOB: 03-30-1945 DOA: 01/05/2016 PCP: Elinor DodgeFernando A Sanchez-Brugal, MD   Brief Narrative:   70 y.o. female, Significant for Alzheimer's dementia, hypertension, CVA, hyperlipidemia, who is his SNF resident, patient is currently nonverbal, no family at bedside, history was obtained from medical records in ED physician, patient is a resident in memory care facility, been having progressive weakness over last 3 weeks, where she had several falls from her wheelchair, so family brought her for further evaluation. Pt diagnosed with UTI.  Assessment & Plan: UTI - Urine culture growing Escherichia coli. Patient has had 4 days of treatment will require 3 more days for complicated UTI. Will transition to oral antibiotic regimen and begin discharge planning.  Active Problems:   CVA (cerebral vascular accident) (HCC) - continue aspirin - CT of head  Reporting old left temporoparietal infarct    Dysphagia, pharyngoesophageal phase - will obtain speech therapy evaluation    Essential hypertension - off of antihypertensive medications - will pace prn hydralazine    HLD (hyperlipidemia) - stable on statin    Dementia - continue aricept and namenda as well as risperdal    Encephalopathy - most likely due to uti    Hypernatremia - Still with elevated sodium level. - I suspect patient still has some dehydration as such will change to one half normal saline and reassess BMP next a.m.    AKI (acute kidney injury) (HCC) - most likely due to dehydration - Resolving  DVT prophylaxis: Heparin Code Status: Full Family Communication: None Disposition Plan: Discussed with nursing. Will consult social worker. DC most likely next a.m.   Consultants:   None   Procedures: None   Antimicrobials: Place on Rocephin   Subjective: Patient is nonverbal gaze  Objective: Vitals:   01/08/16 0610 01/08/16 1422 01/08/16 2108 01/09/16 0503  BP: 139/89  119/68 (!) 151/90 (!) 142/83  Pulse: 72 80 85 69  Resp: 20 18 19 18   Temp: 98.3 F (36.8 C) 97.9 F (36.6 C) 97.6 F (36.4 C) 97.5 F (36.4 C)  TempSrc: Oral Oral Axillary Axillary  SpO2: 98% 100% 95% 96%  Weight:      Height:        Intake/Output Summary (Last 24 hours) at 01/09/16 0929 Last data filed at 01/09/16 0400  Gross per 24 hour  Intake             3170 ml  Output                0 ml  Net             3170 ml   Filed Weights   01/05/16 1641  Weight: 77.1 kg (169 lb 15.6 oz)    Examination:  General exam: Appears calm and comfortable, in nad. Respiratory system: Clear to auscultation. Respiratory effort normal. Cardiovascular system: S1 & S2 heard, RRR. No JVD, murmurs, rubs, gallops or clicks. Gastrointestinal system: Abdomen is nondistended, soft and nontender. No organomegaly or masses felt. Normal bowel sounds heard. Central nervous system: Alert and awake, gazes at examiners general direction Extremities: no rashes on limited exam. + distal pulses Skin: No rashes, lesions or ulcers Psychiatry: unable to assess due to dementia    Data Reviewed: I have personally reviewed following labs and imaging studies  CBC:  Recent Labs Lab 01/05/16 1252 01/06/16 0600  WBC 7.3 6.3  NEUTROABS 4.4  --   HGB 13.6 12.0  HCT 42.9 40.3  MCV 89.0 89.0  PLT 296 182   Basic Metabolic Panel:  Recent Labs Lab 01/05/16 1252 01/05/16 2028 01/06/16 0600 01/08/16 0555  NA 159* 156* 156* 150*  K 3.8  --  3.9 3.7  CL 120*  --  121* 119*  CO2 29  --  27 25  GLUCOSE 116*  --  145* 133*  BUN 70*  --  55* 26*  CREATININE 1.61*  --  1.37* 1.05*  CALCIUM 10.0  --  9.5 8.8*   GFR: Estimated Creatinine Clearance: 49.7 mL/min (by C-G formula based on SCr of 1.05 mg/dL (H)). Liver Function Tests:  Recent Labs Lab 01/05/16 1252  AST 18  ALT 21  ALKPHOS 58  BILITOT 0.6  PROT 8.2*  ALBUMIN 4.4   No results for input(s): LIPASE, AMYLASE in the last 168 hours. No  results for input(s): AMMONIA in the last 168 hours. Coagulation Profile: No results for input(s): INR, PROTIME in the last 168 hours. Cardiac Enzymes: No results for input(s): CKTOTAL, CKMB, CKMBINDEX, TROPONINI in the last 168 hours. BNP (last 3 results) No results for input(s): PROBNP in the last 8760 hours. HbA1C: No results for input(s): HGBA1C in the last 72 hours. CBG: No results for input(s): GLUCAP in the last 168 hours. Lipid Profile: No results for input(s): CHOL, HDL, LDLCALC, TRIG, CHOLHDL, LDLDIRECT in the last 72 hours. Thyroid Function Tests: No results for input(s): TSH, T4TOTAL, FREET4, T3FREE, THYROIDAB in the last 72 hours. Anemia Panel: No results for input(s): VITAMINB12, FOLATE, FERRITIN, TIBC, IRON, RETICCTPCT in the last 72 hours. Sepsis Labs: No results for input(s): PROCALCITON, LATICACIDVEN in the last 168 hours.  Recent Results (from the past 240 hour(s))  Urine culture     Status: Abnormal   Collection Time: 01/05/16 11:51 AM  Result Value Ref Range Status   Specimen Description URINE, RANDOM  Final   Special Requests NONE  Final   Culture >=100,000 COLONIES/mL ESCHERICHIA COLI (A)  Final   Report Status 01/08/2016 FINAL  Final   Organism ID, Bacteria ESCHERICHIA COLI (A)  Final      Susceptibility   Escherichia coli - MIC*    AMPICILLIN 16 INTERMEDIATE Intermediate     CEFAZOLIN <=4 SENSITIVE Sensitive     CEFTRIAXONE <=1 SENSITIVE Sensitive     CIPROFLOXACIN <=0.25 SENSITIVE Sensitive     GENTAMICIN <=1 SENSITIVE Sensitive     IMIPENEM <=0.25 SENSITIVE Sensitive     NITROFURANTOIN <=16 SENSITIVE Sensitive     TRIMETH/SULFA <=20 SENSITIVE Sensitive     AMPICILLIN/SULBACTAM 4 SENSITIVE Sensitive     PIP/TAZO <=4 SENSITIVE Sensitive     Extended ESBL NEGATIVE Sensitive     * >=100,000 COLONIES/mL ESCHERICHIA COLI  MRSA PCR Screening     Status: None   Collection Time: 01/06/16  3:13 AM  Result Value Ref Range Status   MRSA by PCR NEGATIVE  NEGATIVE Final    Comment:        The GeneXpert MRSA Assay (FDA approved for NASAL specimens only), is one component of a comprehensive MRSA colonization surveillance program. It is not intended to diagnose MRSA infection nor to guide or monitor treatment for MRSA infections.          Radiology Studies: No results found.      Scheduled Meds: . aspirin EC  81 mg Oral Daily  . atorvastatin  20 mg Oral QHS  . calcium-vitamin D  1 tablet Oral BID  . cefTRIAXone (ROCEPHIN)  IV  1 g Intravenous Q24H  .  divalproex  250 mg Oral BID  . donepezil  20 mg Oral Q breakfast  . heparin  5,000 Units Subcutaneous Q8H  . memantine  10 mg Oral Q breakfast  . omega-3 acid ethyl esters  1 g Oral Daily  . risperiDONE  0.5 mg Oral Daily  . risperiDONE  1 mg Oral QHS  . sertraline  100 mg Oral Q breakfast   Continuous Infusions: . dextrose 5 % and 0.45% NaCl 75 mL/hr at 01/09/16 0339     LOS: 4 days   Time spent: > 35 minutes  Penny Pia, MD [facility] Pager [phone]  If 7PM-7AM, please contact night-coverage [email or web address] Password [email or web address] 01/09/2016, 9:29 AM

## 2016-01-10 LAB — BASIC METABOLIC PANEL
ANION GAP: 6 (ref 5–15)
BUN: 15 mg/dL (ref 6–20)
CALCIUM: 8.8 mg/dL — AB (ref 8.9–10.3)
CO2: 26 mmol/L (ref 22–32)
Chloride: 109 mmol/L (ref 101–111)
Creatinine, Ser: 0.88 mg/dL (ref 0.44–1.00)
GFR calc Af Amer: >60 mL/min (ref >60)
GLUCOSE: 97 mg/dL (ref 65–99)
Potassium: 3.2 mmol/L — ABNORMAL LOW (ref 3.5–5.1)
SODIUM: 141 mmol/L (ref 135–145)

## 2016-01-10 MED ORDER — CEPHALEXIN 500 MG PO CAPS: 500.0000 mg | ORAL_CAPSULE | Freq: Two times a day (BID) | ORAL | 0 refills | Status: AC

## 2016-01-10 NOTE — Progress Notes (Signed)
PT Cancellation Note  Patient Details Name: Devoria GlassingMary Awbrey MRN: [id number] DOB: [birth year]   Cancelled Treatment:    Reason Eval/Treat Not Completed: PT screened, no needs identified, will sign off. Attempted PT eval. Pt did not follow commands or put forth any effort to participate.    Rebeca AlertJannie Cy Bresee, MPT Pager: [phone]

## 2016-01-10 NOTE — Clinical Social Work Placement (Signed)
   CLINICAL SOCIAL WORK PLACEMENT  NOTE  Date:  01/10/2016  Patient Details  Name: Glenda Coleman MRN: [id number] Date of Birth: Mar 15, 1945  Clinical Social Work is seeking post-discharge placement for this patient at the Assisted Living Facility level of care (*CSW will initial, date and re-position this form in  chart as items are completed):  No   Patient/family provided with [place] Clinical Social Work Department's list of facilities offering this level of care within the geographic area requested by the patient (or if unable, by the patient's family).  No   Patient/family informed of their freedom to choose among providers that offer the needed level of care, that participate in Medicare, Medicaid or managed care program needed by the patient, have an available bed and are willing to accept the patient.  No   Patient/family informed of [place] [facility]'s ownership interest in [facility] and [facility], as well as of the fact that they are under no obligation to receive care at these facilities.  PASRR submitted to EDS on       PASRR number received on       Existing PASRR number confirmed on 01/10/16     FL2 transmitted to all facilities in geographic area requested by pt/family on       FL2 transmitted to all facilities within larger geographic area on       Patient informed that his/her managed care company has contracts with or will negotiate with certain facilities, including the following:  [facility]         Patient/family informed of bed offers received.  [facility]     [facility]    [facility]  Patient to be transferred to facility by [facility]     Patient family notified on 01/10/16 of transfer.  Name of family member notified:  Brother      PHYSICIAN       Additional Comment:   [facility] _______________________________________________ Clearance CootsNicole A Jennaya Pogue, LCSW 01/10/2016, 11:07 AM

## 2016-01-10 NOTE — Progress Notes (Signed)
Discharge instructions accompanied pt, left the unit in stable condition. Transported via ambulance to a nursing facility.

## 2016-01-10 NOTE — Care Management Important Message (Signed)
Important Message  Patient Details  Name: Glenda Coleman MRN: [id number] Date of Birth: [birth year]   Medicare Important Message Given:  Yes    Haskell FlirtJamison, Jaeliana Lococo 01/10/2016, 11:29 AMImportant Message  Patient Details  Name: Glenda Coleman MRN: [id number] Date of Birth: [birth year]   Medicare Important Message Given:  Yes    Haskell FlirtJamison, Namon Villarin 01/10/2016, 11:28 AM

## 2016-01-10 NOTE — Discharge Summary (Signed)
Physician Discharge Summary  Laquandra Carrillo ZOX:[id number] DOB: [birth year] DOA: 01/05/2016  PCP: Elinor Dodge, MD  Admit date: 01/05/2016 Discharge date: 01/10/2016  Time spent: > 35 minutes  Recommendations for Outpatient Follow-up:  1. Monitor K levels 2. Ensure patient completes antibiotic regimen   Discharge Diagnoses:  Active Problems:   CVA (cerebral vascular accident) (HCC)   Dysphagia, pharyngoesophageal phase   Essential hypertension   HLD (hyperlipidemia)   Dementia   Encephalopathy   Hypernatremia   AKI (acute kidney injury) (HCC)   Discharge Condition: stable  Diet recommendation: dysphagia 3  Filed Weights   01/05/16 1641  Weight: 77.1 kg (169 lb 15.6 oz)    History of present illness:  Glenda Coleman  is a 70 y.o. female, Significant for Alzheimer's dementia, hypertension, CVA, hyperlipidemia, who is his SNF resident, patient is currently nonverbal, no family at bedside, history was obtained from medical records in ED physician, patient is a resident in memory care facility, been having progressive weakness over last 3 weeks, where she had several falls from her wheelchair, so family brought her for further evaluation. Pt diagnosed with UTI.  Hospital Course:  UTI - Urine culture growing Escherichia coli. Patient has had 5 days of treatment will require 2 more days for complicated UTI.   Active Problems:   CVA (cerebral vascular accident) (HCC) - continue aspirin - CT of head  Reporting old left temporoparietal infarct    Dysphagia, pharyngoesophageal phase - will obtain speech therapy evaluation    Essential hypertension - continue lisinopril on d/c, d/c hctz    HLD (hyperlipidemia) - stable on statin    Dementia - continue aricept and namenda as well as risperdal    Encephalopathy - most likely due to uti    Hypernatremia - 2ary to dehydration, resolved.    AKI (acute kidney injury) (HCC) - most likely due to  dehydration - Resolving  Procedures:  None  Consultations:  None  Discharge Exam: Vitals:   01/09/16 2258 01/10/16 0651  BP: (!) 146/92 (!) 145/80  Pulse:  61  Resp:    Temp:  97.4 F (36.3 C)    General: Pt in nad, alert and awake Cardiovascular: rrr, no rubs Respiratory: no increased wob, no wheezes  Discharge Instructions   Discharge Instructions    Call MD for:  severe uncontrolled pain    Complete by:  As directed    Call MD for:  temperature >100.4    Complete by:  As directed    Diet - low sodium heart healthy    Complete by:  As directed    Increase activity slowly    Complete by:  As directed      Current Discharge Medication List    START taking these medications   Details  cephALEXin (KEFLEX) 500 MG capsule Take 1 capsule (500 mg total) by mouth every 12 (twelve) hours. Qty: 4 capsule, Refills: 0      CONTINUE these medications which have NOT CHANGED   Details  acetaminophen (TYLENOL) 500 MG tablet Take 500 mg by mouth every 4 (four) hours as needed for mild pain, moderate pain, fever or headache.     alum & mag hydroxide-simeth (MINTOX) 200-200-20 MG/5ML suspension Take 30 mLs by mouth as needed for indigestion or heartburn.    aspirin EC 81 MG EC tablet Take 1 tablet (81 mg total) by mouth daily.    atorvastatin (LIPITOR) 20 MG tablet Take 20 mg by mouth at bedtime.    Biotin  5 MG TABS Take 5 mg by mouth daily with breakfast.     calcium-vitamin D (OSCAL WITH D) 500-200 MG-UNIT tablet Take 1 tablet by mouth 2 (two) times daily.    divalproex (DEPAKOTE SPRINKLE) 125 MG capsule Take 250 mg by mouth 2 (two) times daily.    donepezil (ARICEPT) 10 MG tablet Take 20 mg by mouth daily with breakfast.  Refills: 3    guaifenesin (ROBAFEN) 100 MG/5ML syrup Take 200 mg by mouth every 6 (six) hours as needed for cough.    lisinopril (PRINIVIL,ZESTRIL) 5 MG tablet Take 5 mg by mouth daily with breakfast.  Refills: 3    memantine (NAMENDA) 10 MG  tablet Take 10 mg by mouth daily with breakfast.  Refills: 6    Multiple Vitamin (DAILY VITE PO) Take 1 tablet by mouth daily with breakfast.     neomycin-bacitracin-polymyxin (NEOSPORIN) ointment Apply 1 application topically as needed for wound care.    Omega-3 Fatty Acids (FISH OIL) 1000 MG CAPS Take 2,000 mg by mouth daily with breakfast.     PRESCRIPTION MEDICATION Apply 0.5 mLs topically every 8 (eight) hours as needed (for agitation). Apply to wrist every 8 hours as needed for agitation --- R- Lorazepam PLO Gel 1mg /ml    risperiDONE (RISPERDAL) 1 MG tablet Take 1 tablet (1 mg total) by mouth as directed. Take 0.5mg  qAM and 1mg  qHS    sertraline (ZOLOFT) 100 MG tablet Take 100 mg by mouth daily with breakfast.       STOP taking these medications     amoxicillin (AMOXIL) 500 MG capsule      hydrochlorothiazide (HYDRODIURIL) 25 MG tablet      loperamide (IMODIUM) 2 MG capsule      naproxen (NAPROSYN) 500 MG tablet        No Known Allergies    The results of significant diagnostics from this hospitalization (including imaging, microbiology, ancillary and laboratory) are listed below for reference.    Significant Diagnostic Studies: Dg Lumbar Spine 2-3 Views  Result Date: 12/24/2015 CLINICAL DATA:  Fall.  Back pain EXAM: LUMBAR SPINE - 2-3 VIEW COMPARISON:  None. FINDINGS: Negative for fracture.  No acute bony abnormality Mild disc degeneration and mild anterior slip L3-4. Moderate disc degeneration and spurring L4-5. Advanced disc degeneration and spurring L5-S1. IMPRESSION: Negative for fracture. Electronically Signed   By: Marlan Palauharles  Clark M.D.   On: 12/24/2015 13:50   Ct Head Wo Contrast  Result Date: 01/05/2016 CLINICAL DATA:  Unwitnessed fall, history hypertension, type II diabetes mellitus, stroke, TIA, Alzheimer's, former smoker EXAM: CT HEAD WITHOUT CONTRAST TECHNIQUE: Contiguous axial images were obtained from the base of the skull through the vertex without  intravenous contrast. Scattered motion artifacts despite repeat imaging. COMPARISON:  12/30/2015 FINDINGS: Brain: Assessment limited by motion artifacts. Generalized atrophy. Diffuse dilatation of the ventricular system appears grossly stable. Small vessel chronic ischemic changes of deep cerebral white matter. Old LEFT temporoparietal infarct. No gross evidence of hemorrhage or infarction identified on limited exam. Vascular: Grossly unremarkable Skull: Limits assessment, no gross abnormality seen Sinuses/Orbits: Limited assessment, no gross abnormality seen Other: N/A IMPRESSION: Significant limitations of exam secondary to patient motion despite repeating images. Atrophy with small vessel chronic ischemic changes of deep cerebral white matter. Old LEFT temporoparietal infarct. No gross acute abnormalities identified within limitations of study. Electronically Signed   By: Ulyses SouthwardMark  Boles M.D.   On: 01/05/2016 12:48   Ct Head Wo Contrast  Result Date: 12/30/2015 CLINICAL DATA:  Witnessed fall,  dementia EXAM: CT HEAD WITHOUT CONTRAST CT CERVICAL SPINE WITHOUT CONTRAST TECHNIQUE: Multidetector CT imaging of the head and cervical spine was performed following the standard protocol without intravenous contrast. Multiplanar CT image reconstructions of the cervical spine were also generated. COMPARISON:  12/24/2015 FINDINGS: CT HEAD FINDINGS Motion degraded images. Brain: No evidence of acute infarction, hemorrhage, hydrocephalus, extra-axial collection or mass lesion/mass effect. Encephalomalacic changes related to old left parietal infarct. Vascular: No hyperdense vessel or unexpected calcification. Skull: Normal. Negative for fracture or focal lesion. Sinuses/Orbits: No acute finding. Other: Global cortical and central atrophy. Secondary ventriculomegaly. Subcortical white matter and periventricular small vessel ischemic changes. CT CERVICAL SPINE FINDINGS Alignment: Normal. Skull base and vertebrae: No acute  fracture. No primary bone lesion or focal pathologic process. Soft tissues and spinal canal: No prevertebral fluid or swelling. No visible canal hematoma. Disc levels:  Degenerative changes at C5-6. Spinal canal is patent. Upper chest: Visualized lung apices are clear. Other: Visualized thyroid is mildly heterogeneous. IMPRESSION: No evidence of acute intracranial abnormality. Old left parietal infarct. Atrophy with small vessel ischemic changes. No evidence traumatic injury to the cervical spine. Mild degenerative changes at C5-6. Electronically Signed   By: Charline BillsSriyesh  Krishnan M.D.   On: 12/30/2015 13:00   Ct Head Wo Contrast  Result Date: 12/24/2015 CLINICAL DATA:  Fall at nursing facility.  No loss of consciousness. EXAM: CT HEAD WITHOUT CONTRAST CT CERVICAL SPINE WITHOUT CONTRAST TECHNIQUE: Multidetector CT imaging of the head and cervical spine was performed following the standard protocol without intravenous contrast. Multiplanar CT image reconstructions of the cervical spine were also generated. COMPARISON:  CT scan of August 27, 2015. FINDINGS: CT HEAD FINDINGS Brain: Left parietal encephalomalacia is noted consistent with old infarction. Moderate chronic ischemic white matter disease is noted. No mass effect or midline shift is noted. Ventricular size is within normal limits. There is no evidence of mass lesion, hemorrhage or acute infarction. Stable old cerebellar infarctions are noted. Vascular: No definite abnormality seen. Skull: Bony calvarium appears intact. Sinuses/Orbits: Right maxillary sinusitis is noted. Other: None. CT CERVICAL SPINE FINDINGS Alignment: Normal. Skull base and vertebrae: No fracture is noted. Soft tissues and spinal canal: No definite soft tissue abnormality seen. Disc levels: Severe degenerative disc disease is noted at C5-6 with anterior osteophyte formation. Upper chest: Visualized lung apices appear normal. Other: Degenerative changes is seen involving the right-sided  posterior facet joints. IMPRESSION: Left parietal encephalomalacia consistent with old large infarction. Moderate chronic ischemic white matter disease. No acute intracranial abnormality seen. Severe degenerative disc disease is noted at C5-6. No acute abnormality seen in the cervical spine. Electronically Signed   By: Lupita RaiderJames  Green Jr, M.D.   On: 12/24/2015 14:15   Ct Cervical Spine Wo Contrast  Result Date: 12/30/2015 CLINICAL DATA:  Witnessed fall, dementia EXAM: CT HEAD WITHOUT CONTRAST CT CERVICAL SPINE WITHOUT CONTRAST TECHNIQUE: Multidetector CT imaging of the head and cervical spine was performed following the standard protocol without intravenous contrast. Multiplanar CT image reconstructions of the cervical spine were also generated. COMPARISON:  12/24/2015 FINDINGS: CT HEAD FINDINGS Motion degraded images. Brain: No evidence of acute infarction, hemorrhage, hydrocephalus, extra-axial collection or mass lesion/mass effect. Encephalomalacic changes related to old left parietal infarct. Vascular: No hyperdense vessel or unexpected calcification. Skull: Normal. Negative for fracture or focal lesion. Sinuses/Orbits: No acute finding. Other: Global cortical and central atrophy. Secondary ventriculomegaly. Subcortical white matter and periventricular small vessel ischemic changes. CT CERVICAL SPINE FINDINGS Alignment: Normal. Skull base and vertebrae: No acute  fracture. No primary bone lesion or focal pathologic process. Soft tissues and spinal canal: No prevertebral fluid or swelling. No visible canal hematoma. Disc levels:  Degenerative changes at C5-6. Spinal canal is patent. Upper chest: Visualized lung apices are clear. Other: Visualized thyroid is mildly heterogeneous. IMPRESSION: No evidence of acute intracranial abnormality. Old left parietal infarct. Atrophy with small vessel ischemic changes. No evidence traumatic injury to the cervical spine. Mild degenerative changes at C5-6. Electronically  Signed   By: Charline Bills M.D.   On: 12/30/2015 13:00   Ct Cervical Spine Wo Contrast  Result Date: 12/24/2015 CLINICAL DATA:  Fall at nursing facility.  No loss of consciousness. EXAM: CT HEAD WITHOUT CONTRAST CT CERVICAL SPINE WITHOUT CONTRAST TECHNIQUE: Multidetector CT imaging of the head and cervical spine was performed following the standard protocol without intravenous contrast. Multiplanar CT image reconstructions of the cervical spine were also generated. COMPARISON:  CT scan of August 27, 2015. FINDINGS: CT HEAD FINDINGS Brain: Left parietal encephalomalacia is noted consistent with old infarction. Moderate chronic ischemic white matter disease is noted. No mass effect or midline shift is noted. Ventricular size is within normal limits. There is no evidence of mass lesion, hemorrhage or acute infarction. Stable old cerebellar infarctions are noted. Vascular: No definite abnormality seen. Skull: Bony calvarium appears intact. Sinuses/Orbits: Right maxillary sinusitis is noted. Other: None. CT CERVICAL SPINE FINDINGS Alignment: Normal. Skull base and vertebrae: No fracture is noted. Soft tissues and spinal canal: No definite soft tissue abnormality seen. Disc levels: Severe degenerative disc disease is noted at C5-6 with anterior osteophyte formation. Upper chest: Visualized lung apices appear normal. Other: Degenerative changes is seen involving the right-sided posterior facet joints. IMPRESSION: Left parietal encephalomalacia consistent with old large infarction. Moderate chronic ischemic white matter disease. No acute intracranial abnormality seen. Severe degenerative disc disease is noted at C5-6. No acute abnormality seen in the cervical spine. Electronically Signed   By: Lupita Raider, M.D.   On: 12/24/2015 14:15   Dg Hips Bilat W Or Wo Pelvis 3-4 Views  Result Date: 12/24/2015 CLINICAL DATA:  Fall.  Pain. EXAM: DG HIP (WITH OR WITHOUT PELVIS) 3-4V BILAT COMPARISON:  None. FINDINGS:  Negative for hip fracture bilaterally. No pelvic fracture. Mild to moderate degenerative change in the hip joint bilaterally. Soft tissue calcification left pelvis likely calcified uterine fibroid. IMPRESSION: Negative for fracture. Electronically Signed   By: Marlan Palau M.D.   On: 12/24/2015 13:51    Microbiology: Recent Results (from the past 240 hour(s))  Urine culture     Status: Abnormal   Collection Time: 01/05/16 11:51 AM  Result Value Ref Range Status   Specimen Description URINE, RANDOM  Final   Special Requests NONE  Final   Culture >=100,000 COLONIES/mL ESCHERICHIA COLI (A)  Final   Report Status 01/08/2016 FINAL  Final   Organism ID, Bacteria ESCHERICHIA COLI (A)  Final      Susceptibility   Escherichia coli - MIC*    AMPICILLIN 16 INTERMEDIATE Intermediate     CEFAZOLIN <=4 SENSITIVE Sensitive     CEFTRIAXONE <=1 SENSITIVE Sensitive     CIPROFLOXACIN <=0.25 SENSITIVE Sensitive     GENTAMICIN <=1 SENSITIVE Sensitive     IMIPENEM <=0.25 SENSITIVE Sensitive     NITROFURANTOIN <=16 SENSITIVE Sensitive     TRIMETH/SULFA <=20 SENSITIVE Sensitive     AMPICILLIN/SULBACTAM 4 SENSITIVE Sensitive     PIP/TAZO <=4 SENSITIVE Sensitive     Extended ESBL NEGATIVE Sensitive     * >=  100,000 COLONIES/mL ESCHERICHIA COLI  MRSA PCR Screening     Status: None   Collection Time: 01/06/16  3:13 AM  Result Value Ref Range Status   MRSA by PCR NEGATIVE NEGATIVE Final    Comment:        The GeneXpert MRSA Assay (FDA approved for NASAL specimens only), is one component of a comprehensive MRSA colonization surveillance program. It is not intended to diagnose MRSA infection nor to guide or monitor treatment for MRSA infections.      Labs: Basic Metabolic Panel:  Recent Labs Lab 01/05/16 1252 01/05/16 2028 01/06/16 0600 01/08/16 0555 01/10/16 0452  NA 159* 156* 156* 150* 141  K 3.8  --  3.9 3.7 3.2*  CL 120*  --  121* 119* 109  CO2 29  --  27 25 26   GLUCOSE 116*  --  145*  133* 97  BUN 70*  --  55* 26* 15  CREATININE 1.61*  --  1.37* 1.05* 0.88  CALCIUM 10.0  --  9.5 8.8* 8.8*   Liver Function Tests:  Recent Labs Lab 01/05/16 1252  AST 18  ALT 21  ALKPHOS 58  BILITOT 0.6  PROT 8.2*  ALBUMIN 4.4   No results for input(s): LIPASE, AMYLASE in the last 168 hours. No results for input(s): AMMONIA in the last 168 hours. CBC:  Recent Labs Lab 01/05/16 1252 01/06/16 0600  WBC 7.3 6.3  NEUTROABS 4.4  --   HGB 13.6 12.0  HCT 42.9 40.3  MCV 89.0 89.0  PLT 296 182   Cardiac Enzymes: No results for input(s): CKTOTAL, CKMB, CKMBINDEX, TROPONINI in the last 168 hours. BNP: BNP (last 3 results) No results for input(s): BNP in the last 8760 hours.  ProBNP (last 3 results) No results for input(s): PROBNP in the last 8760 hours.  CBG: No results for input(s): GLUCAP in the last 168 hours.   Signed:  Penny Pia MD.  [facility] 01/10/2016, 1:55 PM

## 2016-01-10 NOTE — Progress Notes (Signed)
Signed FL2 faxed. Nurse given report [facility] called for transport.

## 2016-01-12 ENCOUNTER — Emergency Department (HOSPITAL_COMMUNITY): Payer: Medicare Other

## 2016-01-12 ENCOUNTER — Encounter (HOSPITAL_COMMUNITY): Payer: Self-pay | Admitting: *Deleted

## 2016-01-12 ENCOUNTER — Emergency Department (HOSPITAL_COMMUNITY)
Admission: EM | Admit: 2016-01-12 | Discharge: 2016-01-12 | Disposition: A | Discharge status: Home / Self Care | Payer: Medicare Other | Attending: Emergency Medicine | Admitting: Emergency Medicine

## 2016-01-12 DIAGNOSIS — Y939 Activity, unspecified: Secondary | ICD-10-CM | POA: Insufficient documentation

## 2016-01-12 DIAGNOSIS — W1839XA Other fall on same level, initial encounter: Secondary | ICD-10-CM | POA: Diagnosis not present

## 2016-01-12 DIAGNOSIS — F0390 Unspecified dementia without behavioral disturbance: Secondary | ICD-10-CM | POA: Diagnosis not present

## 2016-01-12 DIAGNOSIS — I10 Essential (primary) hypertension: Secondary | ICD-10-CM | POA: Insufficient documentation

## 2016-01-12 DIAGNOSIS — E119 Type 2 diabetes mellitus without complications: Secondary | ICD-10-CM | POA: Diagnosis not present

## 2016-01-12 DIAGNOSIS — Y929 Unspecified place or not applicable: Secondary | ICD-10-CM | POA: Insufficient documentation

## 2016-01-12 DIAGNOSIS — W19XXXA Unspecified fall, initial encounter: Secondary | ICD-10-CM

## 2016-01-12 DIAGNOSIS — Y999 Unspecified external cause status: Secondary | ICD-10-CM | POA: Insufficient documentation

## 2016-01-12 DIAGNOSIS — Z7982 Long term (current) use of aspirin: Secondary | ICD-10-CM | POA: Diagnosis not present

## 2016-01-12 DIAGNOSIS — Z79899 Other long term (current) drug therapy: Secondary | ICD-10-CM | POA: Insufficient documentation

## 2016-01-12 DIAGNOSIS — Z87891 Personal history of nicotine dependence: Secondary | ICD-10-CM | POA: Diagnosis not present

## 2016-01-12 DIAGNOSIS — S0181XA Laceration without foreign body of other part of head, initial encounter: Secondary | ICD-10-CM | POA: Insufficient documentation

## 2016-01-12 DIAGNOSIS — S0990XA Unspecified injury of head, initial encounter: Secondary | ICD-10-CM | POA: Diagnosis present

## 2016-01-12 NOTE — ED Notes (Signed)
Pts wound to forehead cleaned by RN

## 2016-01-12 NOTE — ED Provider Notes (Signed)
[facility]-EMERGENCY DEPT Provider Note   CSN: [id number] Arrival date & time: 01/12/16  1130     History   Chief Complaint Chief Complaint  Patient presents with  . Fall  . Head Laceration   Level 5 caveat: Dementia HPI Glenda Coleman is a 70 y.o. female.  HPI patient with history of severe dementia here via EMS from [facility] nursing home facility. Patient reportedly had unwitnessed fall and sustained laceration under left eye. Leading is controlled. Per facility, patient is at baseline. She does not interact with exam or questioning  Past Medical History:  Diagnosis Date  . Alzheimer disease   . Arthritis    "back" (08/27/2015)  . Chronic lower back pain   . Dementia    "severe" (08/27/2015)  . Depression   . Hypertension   . Stroke (HCC) 08/27/2015  . TIA (transient ischemic attack) dx'd 2015   "dr said she'd had a few; put her on aspirin at that point"  . Type II diabetes mellitus [facility])     Patient Active Problem List   Diagnosis Date Noted  . Encephalopathy 01/05/2016  . Hypernatremia 01/05/2016  . AKI (acute kidney injury) (HCC) 01/05/2016  . DNR (do not resuscitate)   . Palliative care encounter   . Goals of care, counseling/discussion   . Acute CVA (cerebrovascular accident) (HCC) 08/28/2015  . Stroke (cerebrum) (HCC)   . Dysphagia, pharyngoesophageal phase   . Essential hypertension   . HLD (hyperlipidemia)   . Dementia   . CVA (cerebral vascular accident) (HCC) 08/27/2015    Past Surgical History:  Procedure Laterality Date  . ABDOMINAL HYSTERECTOMY    . PARTIAL KNEE ARTHROPLASTY Left     OB History    No data available       Home Medications    Prior to Admission medications   Medication Sig Start Date End Date Taking? Authorizing Provider  acetaminophen (TYLENOL) 500 MG tablet Take 500 mg by mouth every 4 (four) hours as needed for mild pain, moderate pain, fever or headache.    Yes Historical Provider, MD  aspirin EC 81 MG EC tablet  Take 1 tablet (81 mg total) by mouth daily. Patient taking differently: Take 81 mg by mouth daily with breakfast.  09/02/15  Yes Fuller Plan, MD  atorvastatin (LIPITOR) 20 MG tablet Take 20 mg by mouth at bedtime.   Yes Historical Provider, MD  Biotin 5 MG TABS Take 5 mg by mouth daily with breakfast.    Yes Historical Provider, MD  calcium-vitamin D (OSCAL WITH D) 500-200 MG-UNIT tablet Take 1 tablet by mouth 2 (two) times daily.   Yes Historical Provider, MD  cephALEXin (KEFLEX) 500 MG capsule Take 1 capsule (500 mg total) by mouth every 12 (twelve) hours. 01/10/16 01/12/16 Yes Penny Pia, MD  divalproex (DEPAKOTE SPRINKLE) 125 MG capsule Take 250 mg by mouth 2 (two) times daily.   Yes Historical Provider, MD  donepezil (ARICEPT) 10 MG tablet Take 20 mg by mouth daily with breakfast.  09/24/14  Yes Historical Provider, MD  guaifenesin (ROBAFEN) 100 MG/5ML syrup Take 200 mg by mouth every 6 (six) hours as needed for cough.   Yes Historical Provider, MD  lisinopril (PRINIVIL,ZESTRIL) 5 MG tablet Take 5 mg by mouth daily with breakfast.  09/08/14  Yes Historical Provider, MD  memantine (NAMENDA) 10 MG tablet Take 10 mg by mouth daily with breakfast.  09/30/14  Yes Historical Provider, MD  Multiple Vitamin (DAILY VITE PO) Take 1 tablet by  mouth daily with breakfast.    Yes Historical Provider, MD  neomycin-bacitracin-polymyxin (NEOSPORIN) ointment Apply 1 application topically as needed for wound care.   Yes Historical Provider, MD  Omega-3 Fatty Acids (FISH OIL) 1000 MG CAPS Take 2,000 mg by mouth daily with breakfast.    Yes Historical Provider, MD  PRESCRIPTION MEDICATION Apply 0.5 mLs topically every 8 (eight) hours as needed (for agitation). Apply to wrist every 8 hours as needed for agitation --- R- Lorazepam PLO Gel 1mg /ml   Yes Historical Provider, MD  risperiDONE (RISPERDAL) 1 MG tablet Take 1 tablet (1 mg total) by mouth as directed. Take 0.5mg  qAM and 1mg  qHS Patient taking differently:  Take 0.5-1 mg by mouth 2 (two) times daily. Takes 0.5mg  in the morning and 1mg  at bedtime 09/02/15  Yes Fuller Plan, MD  sertraline (ZOLOFT) 100 MG tablet Take 100 mg by mouth daily with breakfast.    Yes Historical Provider, MD    Family History No family history on file.  Social History Social History  Substance Use Topics  . Smoking status: Former Smoker    Years: 25.00    Types: Cigarettes  . Smokeless tobacco: Former Neurosurgeon    Types: Chew     Comment: "stoped smoking cigarettes & chewng in the 1980s"  . Alcohol use Yes     Comment: 08/27/2015 "quit in the 1970s"     Allergies   Review of patient's allergies indicates no known allergies.   Review of Systems Review of Systems  Unable to perform ROS: Dementia     Physical Exam Updated Vital Signs BP 128/78 (BP Location: Right Arm)   Pulse 68   Temp 98.3 F (36.8 C) (Axillary)   Resp 16   SpO2 100%   Physical Exam  Constitutional: She appears well-developed. No distress.  Nontoxic in appearance. Does not interact or comply with exam  HENT:  Head: Normocephalic.  Right Ear: External ear normal.  Left Ear: External ear normal.  Mouth/Throat: Oropharynx is clear and moist.  Very small, superficial laceration to left apex bridge of nose as well as along the nasolabial fold of left cheek. Dried blood along left eyebrow and forehead.  Eyes: Conjunctivae are normal. Pupils are equal, round, and reactive to light.  Neck: Normal range of motion. No JVD present.  Cardiovascular: Normal rate, regular rhythm and normal heart sounds.   Pulmonary/Chest: Effort normal and breath sounds normal. No stridor.  Abdominal: Soft. There is no tenderness.  Musculoskeletal: Normal range of motion.  Neurological:  Patient is responsive to pain during examination. Moves all extremities spontaneously.  Skin: No rash noted. She is not diaphoretic.  Psychiatric: She has a normal mood and affect. Her behavior is normal. Thought content  normal.  Nursing note and vitals reviewed.    ED Treatments / Results  Labs (all labs ordered are listed, but only abnormal results are displayed) Labs Reviewed - No data to display  EKG  EKG Interpretation None       Radiology Ct Head Wo Contrast  Result Date: 01/12/2016 CLINICAL DATA:  Head laceration after fall today. EXAM: CT HEAD WITHOUT CONTRAST CT CERVICAL SPINE WITHOUT CONTRAST TECHNIQUE: Multidetector CT imaging of the head and cervical spine was performed following the standard protocol without intravenous contrast. Multiplanar CT image reconstructions of the cervical spine were also generated. COMPARISON:  CT scan of January 05, 2016. FINDINGS: CT HEAD FINDINGS Brain: Left parietal encephalomalacia is noted consistent with old infarction. Mild chronic ischemic white matter  disease is noted. No mass effect or midline shift is noted. Stable ventricular dilatation is noted most likely due to surrounding atrophy. There is no evidence of hemorrhage, mass lesion or acute infarction. Vascular: Atherosclerosis of carotid siphons is noted. Skull: Bony calvarium appears intact. Sinuses/Orbits: Right maxillary sinusitis is noted. Other: None. CT CERVICAL SPINE FINDINGS Alignment: Normal. Skull base and vertebrae: No definite fracture is noted. Soft tissues and spinal canal: No soft tissue abnormality is noted. Disc levels: Moderate degenerative disc disease is noted at C5-6 with anterior osteophyte formation. Upper chest: Visualized lung apices are unremarkable. Other: Degenerative changes seen involving posterior facet joints bilaterally. IMPRESSION: Old left parietal infarction. Mild chronic ischemic white matter disease. Mild diffuse cortical atrophy. Right maxillary sinusitis. No acute intracranial abnormality seen. Moderate degenerative disc disease is noted at C5-6. No acute abnormality seen in the cervical spine. Electronically Signed   By: Lupita RaiderJames  Green Jr, M.D.   On: 01/12/2016 12:54    Ct Cervical Spine Wo Contrast  Result Date: 01/12/2016 CLINICAL DATA:  Head laceration after fall today. EXAM: CT HEAD WITHOUT CONTRAST CT CERVICAL SPINE WITHOUT CONTRAST TECHNIQUE: Multidetector CT imaging of the head and cervical spine was performed following the standard protocol without intravenous contrast. Multiplanar CT image reconstructions of the cervical spine were also generated. COMPARISON:  CT scan of January 05, 2016. FINDINGS: CT HEAD FINDINGS Brain: Left parietal encephalomalacia is noted consistent with old infarction. Mild chronic ischemic white matter disease is noted. No mass effect or midline shift is noted. Stable ventricular dilatation is noted most likely due to surrounding atrophy. There is no evidence of hemorrhage, mass lesion or acute infarction. Vascular: Atherosclerosis of carotid siphons is noted. Skull: Bony calvarium appears intact. Sinuses/Orbits: Right maxillary sinusitis is noted. Other: None. CT CERVICAL SPINE FINDINGS Alignment: Normal. Skull base and vertebrae: No definite fracture is noted. Soft tissues and spinal canal: No soft tissue abnormality is noted. Disc levels: Moderate degenerative disc disease is noted at C5-6 with anterior osteophyte formation. Upper chest: Visualized lung apices are unremarkable. Other: Degenerative changes seen involving posterior facet joints bilaterally. IMPRESSION: Old left parietal infarction. Mild chronic ischemic white matter disease. Mild diffuse cortical atrophy. Right maxillary sinusitis. No acute intracranial abnormality seen. Moderate degenerative disc disease is noted at C5-6. No acute abnormality seen in the cervical spine. Electronically Signed   By: Lupita RaiderJames  Green Jr, M.D.   On: 01/12/2016 12:54    Procedures Procedures (including critical care time)  Medications Ordered in ED Medications - No data to display  Vitals:   01/12/16 1145 01/12/16 1336 01/12/16 1427  BP: 99/65 138/64 128/78  Pulse: 88 67 68  Resp: 18 16  16   Temp: 98.3 F (36.8 C)    TempSrc: Axillary    SpO2: 95% 100% 100%    Initial Impression / Assessment and Plan / ED Course  I have reviewed the triage vital signs and the nursing notes.  Pertinent labs & imaging results that were available during my care of the patient were reviewed by me and considered in my medical decision making (see chart for details).  Clinical Course    Patient from nursing facility, history of severe dementia, here for evaluation of unwitnessed fall. Noncommunicative, at baseline. Will obtain CT head, CT cervical spine. Plan to repair laceration with Dermabond. Anticipate discharge back to facility. Imaging is negative. Prior to patient discharge, I discussed and reviewed this case with Dr. Clarene DukeLittle     Final Clinical Impressions(s) / ED Diagnoses  Final diagnoses:  Facial laceration, initial encounter  Fall, initial encounter    New Prescriptions Discharge Medication List as of 01/12/2016  2:38 PM       Joycie PeekBenjamin Lounette Sloan, PA-C 01/12/16 1539    Laurence Spatesachel Morgan Little, MD 01/12/16 [phone]

## 2016-01-12 NOTE — Discharge Instructions (Signed)
Your Head and neck CT were reassuring. Please follow up with your doctor for wound check in the next 2-3 days. Return to ED for any new or worrisome symptoms.

## 2016-01-12 NOTE — ED Notes (Signed)
[facility] notified for Pt transportation 

## 2016-01-12 NOTE — ED Notes (Signed)
Patient transported to CT 

## 2016-01-12 NOTE — ED Notes (Signed)
Bed: [facility]21 Expected date:  Expected time:  Means of arrival:  Comments: EMS 70 yo fall, lac

## 2016-01-12 NOTE — ED Triage Notes (Signed)
[facility] EMS [facility] with c/o fall today, per EMS pt has laceration above left eye, bleeding controlled. Pt has hx of dementia, per EMS mentation at baseline

## 2016-04-21 ENCOUNTER — Encounter (HOSPITAL_COMMUNITY): Payer: Self-pay | Admitting: *Deleted

## 2016-04-21 ENCOUNTER — Emergency Department (HOSPITAL_COMMUNITY)
Admission: EM | Admit: 2016-04-21 | Discharge: 2016-04-21 | Disposition: A | Discharge status: Home / Self Care | Payer: Medicare Other | Attending: Emergency Medicine | Admitting: Emergency Medicine

## 2016-04-21 ENCOUNTER — Emergency Department (HOSPITAL_COMMUNITY): Payer: Medicare Other

## 2016-04-21 DIAGNOSIS — Z7982 Long term (current) use of aspirin: Secondary | ICD-10-CM | POA: Diagnosis not present

## 2016-04-21 DIAGNOSIS — Y939 Activity, unspecified: Secondary | ICD-10-CM | POA: Insufficient documentation

## 2016-04-21 DIAGNOSIS — Y999 Unspecified external cause status: Secondary | ICD-10-CM | POA: Diagnosis not present

## 2016-04-21 DIAGNOSIS — S0990XA Unspecified injury of head, initial encounter: Secondary | ICD-10-CM

## 2016-04-21 DIAGNOSIS — I10 Essential (primary) hypertension: Secondary | ICD-10-CM | POA: Insufficient documentation

## 2016-04-21 DIAGNOSIS — Z8673 Personal history of transient ischemic attack (TIA), and cerebral infarction without residual deficits: Secondary | ICD-10-CM | POA: Diagnosis not present

## 2016-04-21 DIAGNOSIS — Z79899 Other long term (current) drug therapy: Secondary | ICD-10-CM | POA: Diagnosis not present

## 2016-04-21 DIAGNOSIS — S0081XA Abrasion of other part of head, initial encounter: Secondary | ICD-10-CM | POA: Insufficient documentation

## 2016-04-21 DIAGNOSIS — Y929 Unspecified place or not applicable: Secondary | ICD-10-CM | POA: Insufficient documentation

## 2016-04-21 DIAGNOSIS — E119 Type 2 diabetes mellitus without complications: Secondary | ICD-10-CM | POA: Diagnosis not present

## 2016-04-21 DIAGNOSIS — Z96652 Presence of left artificial knee joint: Secondary | ICD-10-CM | POA: Insufficient documentation

## 2016-04-21 DIAGNOSIS — W182XXA Fall in (into) shower or empty bathtub, initial encounter: Secondary | ICD-10-CM | POA: Insufficient documentation

## 2016-04-21 DIAGNOSIS — G309 Alzheimer's disease, unspecified: Secondary | ICD-10-CM | POA: Insufficient documentation

## 2016-04-21 DIAGNOSIS — W19XXXA Unspecified fall, initial encounter: Secondary | ICD-10-CM

## 2016-04-21 DIAGNOSIS — Z87891 Personal history of nicotine dependence: Secondary | ICD-10-CM | POA: Diagnosis not present

## 2016-04-21 NOTE — ED Notes (Signed)
Bedside report received from Ellen RN 

## 2016-04-21 NOTE — ED Notes (Signed)
Pt refused last set of vitals

## 2016-04-21 NOTE — ED Triage Notes (Signed)
Per EMS, pt from [facility] fell in the shower this afternoon, hitting right forehead. Staff witnessed fall. Pt did not lose consciousness. Pt has hx of alzheimer's.

## 2016-04-21 NOTE — ED Provider Notes (Signed)
[facility]-EMERGENCY DEPT Provider Note   CSN: [id number] Arrival date & time: 04/21/16  1746     History   Chief Complaint Chief Complaint  Patient presents with  . Fall    HPI Glenda Coleman is a 71 y.o. female.  Patient is a 71 year old female with a history of dementia who presents from a nursing facility after a fall. She was in the shower with staff and slipped and fell in the shower striking her head. There is no loss of consciousness. Patient is at her baseline mental status per family who is at bedside. She hasn't been complaining of any specific areas of tenderness. She is not on anticoagulants.      Past Medical History:  Diagnosis Date  . Alzheimer disease   . Arthritis    "back" (08/27/2015)  . Chronic lower back pain   . Dementia    "severe" (08/27/2015)  . Depression   . Hypertension   . Stroke (HCC) 08/27/2015  . TIA (transient ischemic attack) dx'd 2015   "dr said she'd had a few; put her on aspirin at that point"  . Type II diabetes mellitus [facility])     Patient Active Problem List   Diagnosis Date Noted  . Encephalopathy 01/05/2016  . Hypernatremia 01/05/2016  . AKI (acute kidney injury) (HCC) 01/05/2016  . DNR (do not resuscitate)   . Palliative care encounter   . Goals of care, counseling/discussion   . Acute CVA (cerebrovascular accident) (HCC) 08/28/2015  . Stroke (cerebrum) (HCC)   . Dysphagia, pharyngoesophageal phase   . Essential hypertension   . HLD (hyperlipidemia)   . Dementia   . CVA (cerebral vascular accident) (HCC) 08/27/2015    Past Surgical History:  Procedure Laterality Date  . ABDOMINAL HYSTERECTOMY    . PARTIAL KNEE ARTHROPLASTY Left     OB History    No data available       Home Medications    Prior to Admission medications   Medication Sig Start Date End Date Taking? Authorizing Provider  acetaminophen (TYLENOL) 500 MG tablet Take 500 mg by mouth every 4 (four) hours as needed for mild pain, moderate pain, fever or  headache.    Yes Historical Provider, MD  aspirin EC 81 MG EC tablet Take 1 tablet (81 mg total) by mouth daily. Patient taking differently: Take 81 mg by mouth daily with breakfast.  09/02/15  Yes Fuller Plan, MD  atorvastatin (LIPITOR) 20 MG tablet Take 20 mg by mouth at bedtime.   Yes Historical Provider, MD  Biotin 5 MG TABS Take 5 mg by mouth daily with breakfast.    Yes Historical Provider, MD  calcium-vitamin D (OSCAL WITH D) 500-200 MG-UNIT tablet Take 1 tablet by mouth 2 (two) times daily.   Yes Historical Provider, MD  divalproex (DEPAKOTE SPRINKLE) 125 MG capsule Take 250 mg by mouth 2 (two) times daily.   Yes Historical Provider, MD  donepezil (ARICEPT) 10 MG tablet Take 20 mg by mouth daily with breakfast.  09/24/14  Yes Historical Provider, MD  guaifenesin (ROBAFEN) 100 MG/5ML syrup Take 200 mg by mouth every 6 (six) hours as needed for cough.   Yes Historical Provider, MD  lisinopril (PRINIVIL,ZESTRIL) 5 MG tablet Take 5 mg by mouth daily with breakfast.  09/08/14  Yes Historical Provider, MD  memantine (NAMENDA) 10 MG tablet Take 10 mg by mouth daily with breakfast.  09/30/14  Yes Historical Provider, MD  Multiple Vitamin (MULTIVITAMIN WITH MINERALS) TABS tablet Take 1 tablet  by mouth every morning.   Yes Historical Provider, MD  neomycin-bacitracin-polymyxin (NEOSPORIN) ointment Apply 1 application topically as needed for wound care.   Yes Historical Provider, MD  omega-3 acid ethyl esters (LOVAZA) 1 g capsule Take 2 g by mouth every morning.   Yes Historical Provider, MD  oseltamivir (TAMIFLU) 75 MG capsule Take 75 mg by mouth daily. 04/18/16  Yes Historical Provider, MD  PRESCRIPTION MEDICATION Apply 0.5 mLs topically every 8 (eight) hours as needed (for agitation). Apply to wrist every 8 hours as needed for agitation --- R- Lorazepam PLO Gel 1mg /ml   Yes Historical Provider, MD  risperiDONE (RISPERDAL) 1 MG tablet Take 1 tablet (1 mg total) by mouth as directed. Take 0.5mg  qAM  and 1mg  qHS Patient taking differently: Take 1 mg by mouth at bedtime.  09/02/15  Yes Fuller Planhristopher W Rice, MD  sertraline (ZOLOFT) 100 MG tablet Take 100 mg by mouth daily with breakfast.    Yes Historical Provider, MD    Family History No family history on file.  Social History Social History  Substance Use Topics  . Smoking status: Former Smoker    Years: 25.00    Types: Cigarettes  . Smokeless tobacco: Former NeurosurgeonUser    Types: Chew     Comment: "stoped smoking cigarettes & chewng in the 1980s"  . Alcohol use Yes     Comment: 08/27/2015 "quit in the 1970s"     Allergies   Patient has no known allergies.   Review of Systems Review of Systems  Unable to perform ROS: Dementia     Physical Exam Updated Vital Signs BP 178/82 (BP Location: Right Arm)   Pulse 93   Temp 98.5 F (36.9 C) (Oral)   SpO2 96%   Physical Exam  Constitutional: She appears well-developed and well-nourished.  HENT:  Head: Normocephalic.  Small abrasion to right forehead  Eyes: Pupils are equal, round, and reactive to light.  Neck:  Some apparent discomfort on palpation of her cervical spine. There is no discomfort on palpation of her thoracic or lumbosacral spine  Cardiovascular: Normal rate, regular rhythm and normal heart sounds.   Pulmonary/Chest: Effort normal and breath sounds normal. No respiratory distress. She has no wheezes. She has no rales. She exhibits no tenderness.  Abdominal: Soft. Bowel sounds are normal. There is no tenderness. There is no rebound and no guarding.  Musculoskeletal: Normal range of motion. She exhibits no edema.  No pain on palpation or range of motion of the extremities including the hips  Lymphadenopathy:    She has no cervical adenopathy.  Neurological: She is alert.  Alert and responsive but does not answer questions. She is moving all extremities symmetrically  Skin: Skin is warm and dry. No rash noted.  Psychiatric: She has a normal mood and affect.      ED Treatments / Results  Labs (all labs ordered are listed, but only abnormal results are displayed) Labs Reviewed - No data to display  EKG  EKG Interpretation None       Radiology Ct Head Wo Contrast  Result Date: 04/21/2016 CLINICAL DATA:  Larey SeatFell in shower today, hitting right forehead. Initial encounter. EXAM: CT HEAD WITHOUT CONTRAST CT CERVICAL SPINE WITHOUT CONTRAST TECHNIQUE: Multidetector CT imaging of the head and cervical spine was performed following the standard protocol without intravenous contrast. Multiplanar CT image reconstructions of the cervical spine were also generated. COMPARISON:  01/12/2016 FINDINGS: CT HEAD FINDINGS Brain: A chronic large posterior left MCA infarct is again  seen. Small chronic infarcts are again seen in the right frontal lobe, thalami, and bilateral cerebellum. Confluent cerebral white matter hypodensities elsewhere are similar to the prior study and compatible with extensive chronic small vessel ischemic disease. Chronic ventriculomegaly is unchanged and may reflect central predominant cerebral atrophy or communicating hydrocephalus. There is no evidence of acute cortical infarct, intracranial hemorrhage, mass, midline shift, or extra-axial fluid collection. Vascular: No hyperdense vessel or unexpected calcification. Skull: No fracture or focal osseous lesion. Sinuses/Orbits: Partially visualized chronic right maxillary sinusitis. Clear mastoid air cells. Unremarkable orbits. Other: Mild right frontal scalp soft tissue swelling. CT CERVICAL SPINE FINDINGS Alignment: Unchanged.  No evidence of traumatic subluxation. Skull base and vertebrae: No evidence of acute fracture or destructive osseous process. Soft tissues and spinal canal: No prevertebral fluid or swelling. No visible canal hematoma. Disc levels: Moderate disc space narrowing and predominantly anterior osteophyte formation at C5-6, unchanged. Mild-to-moderate cervical facet arthrosis, most  notable on the right at C4-5 and bilaterally at C7-T1. Upper chest: Partially visualized 9 x 5 mm ground-glass nodule in the right lung apex, unchanged. Other: None. IMPRESSION: 1. No evidence of acute intracranial abnormality. 2. Mild right frontal scalp soft tissue swelling. 3. Chronic ischemic changes as above including a large posterior left MCA infarct. 4. No evidence of acute fracture or subluxation in the cervical spine. Electronically Signed   By: Sebastian Ache M.D.   On: 04/21/2016 19:42   Ct Cervical Spine Wo Contrast  Result Date: 04/21/2016 CLINICAL DATA:  Larey Seat in shower today, hitting right forehead. Initial encounter. EXAM: CT HEAD WITHOUT CONTRAST CT CERVICAL SPINE WITHOUT CONTRAST TECHNIQUE: Multidetector CT imaging of the head and cervical spine was performed following the standard protocol without intravenous contrast. Multiplanar CT image reconstructions of the cervical spine were also generated. COMPARISON:  01/12/2016 FINDINGS: CT HEAD FINDINGS Brain: A chronic large posterior left MCA infarct is again seen. Small chronic infarcts are again seen in the right frontal lobe, thalami, and bilateral cerebellum. Confluent cerebral white matter hypodensities elsewhere are similar to the prior study and compatible with extensive chronic small vessel ischemic disease. Chronic ventriculomegaly is unchanged and may reflect central predominant cerebral atrophy or communicating hydrocephalus. There is no evidence of acute cortical infarct, intracranial hemorrhage, mass, midline shift, or extra-axial fluid collection. Vascular: No hyperdense vessel or unexpected calcification. Skull: No fracture or focal osseous lesion. Sinuses/Orbits: Partially visualized chronic right maxillary sinusitis. Clear mastoid air cells. Unremarkable orbits. Other: Mild right frontal scalp soft tissue swelling. CT CERVICAL SPINE FINDINGS Alignment: Unchanged.  No evidence of traumatic subluxation. Skull base and vertebrae: No  evidence of acute fracture or destructive osseous process. Soft tissues and spinal canal: No prevertebral fluid or swelling. No visible canal hematoma. Disc levels: Moderate disc space narrowing and predominantly anterior osteophyte formation at C5-6, unchanged. Mild-to-moderate cervical facet arthrosis, most notable on the right at C4-5 and bilaterally at C7-T1. Upper chest: Partially visualized 9 x 5 mm ground-glass nodule in the right lung apex, unchanged. Other: None. IMPRESSION: 1. No evidence of acute intracranial abnormality. 2. Mild right frontal scalp soft tissue swelling. 3. Chronic ischemic changes as above including a large posterior left MCA infarct. 4. No evidence of acute fracture or subluxation in the cervical spine. Electronically Signed   By: Sebastian Ache M.D.   On: 04/21/2016 19:42    Procedures Procedures (including critical care time)  Medications Ordered in ED Medications - No data to display   Initial Impression / Assessment and Plan / ED  Course  I have reviewed the triage vital signs and the nursing notes.  Pertinent labs & imaging results that were available during my care of the patient were reviewed by me and considered in my medical decision making (see chart for details).     Patient presents after a witnessed fall. It sounds like it was a mechanical fall. She doesn't have any change in behavior. No fevers or other recent illnesses. She has no evidence of intracranial hemorrhage or acute cervical spine injury. She has no other complaints of pain on exam. She was discharged back to nursing facility.  Final Clinical Impressions(s) / ED Diagnoses   Final diagnoses:  Fall, initial encounter  Minor head injury, initial encounter    New Prescriptions New Prescriptions   No medications on file     Rolan Bucco, MD 04/21/16 [phone]

## 2016-04-21 NOTE — ED Notes (Addendum)
Per family, patient is at baseline.  [facility] head.  Per family patient takes a daily Aspirin.  Patient does not vocalize pain.

## 2016-04-21 NOTE — ED Notes (Signed)
[facility] Expected date:  Expected time:  Means of arrival:  Comments: 71 yo fall

## 2016-08-11 DIAGNOSIS — E860 Dehydration: Secondary | ICD-10-CM

## 2016-08-11 DIAGNOSIS — E870 Hyperosmolality and hypernatremia: Secondary | ICD-10-CM

## 2016-08-11 HISTORY — DX: Dehydration: E86.0

## 2016-08-11 HISTORY — DX: Hyperosmolality and hypernatremia: E87.0

## 2016-09-01 ENCOUNTER — Encounter (HOSPITAL_COMMUNITY): Payer: Self-pay

## 2016-09-01 ENCOUNTER — Emergency Department (HOSPITAL_COMMUNITY)
Admission: EM | Admit: 2016-09-01 | Discharge: 2016-09-01 | Disposition: A | Discharge status: Home / Self Care | Payer: Medicare Other | Attending: Emergency Medicine | Admitting: Emergency Medicine

## 2016-09-01 ENCOUNTER — Emergency Department (HOSPITAL_COMMUNITY): Payer: Medicare Other

## 2016-09-01 DIAGNOSIS — Z8673 Personal history of transient ischemic attack (TIA), and cerebral infarction without residual deficits: Secondary | ICD-10-CM | POA: Insufficient documentation

## 2016-09-01 DIAGNOSIS — S79912A Unspecified injury of left hip, initial encounter: Secondary | ICD-10-CM | POA: Insufficient documentation

## 2016-09-01 DIAGNOSIS — G309 Alzheimer's disease, unspecified: Secondary | ICD-10-CM | POA: Insufficient documentation

## 2016-09-01 DIAGNOSIS — I10 Essential (primary) hypertension: Secondary | ICD-10-CM | POA: Diagnosis not present

## 2016-09-01 DIAGNOSIS — F0280 Dementia in other diseases classified elsewhere without behavioral disturbance: Secondary | ICD-10-CM | POA: Insufficient documentation

## 2016-09-01 DIAGNOSIS — M79605 Pain in left leg: Secondary | ICD-10-CM | POA: Diagnosis not present

## 2016-09-01 DIAGNOSIS — Y999 Unspecified external cause status: Secondary | ICD-10-CM | POA: Insufficient documentation

## 2016-09-01 DIAGNOSIS — Z87891 Personal history of nicotine dependence: Secondary | ICD-10-CM | POA: Insufficient documentation

## 2016-09-01 DIAGNOSIS — Z79899 Other long term (current) drug therapy: Secondary | ICD-10-CM | POA: Insufficient documentation

## 2016-09-01 DIAGNOSIS — Z7982 Long term (current) use of aspirin: Secondary | ICD-10-CM | POA: Insufficient documentation

## 2016-09-01 DIAGNOSIS — E119 Type 2 diabetes mellitus without complications: Secondary | ICD-10-CM | POA: Diagnosis not present

## 2016-09-01 DIAGNOSIS — Y92129 Unspecified place in nursing home as the place of occurrence of the external cause: Secondary | ICD-10-CM | POA: Insufficient documentation

## 2016-09-01 DIAGNOSIS — W19XXXA Unspecified fall, initial encounter: Secondary | ICD-10-CM

## 2016-09-01 DIAGNOSIS — Y939 Activity, unspecified: Secondary | ICD-10-CM | POA: Insufficient documentation

## 2016-09-01 DIAGNOSIS — W050XXA Fall from non-moving wheelchair, initial encounter: Secondary | ICD-10-CM | POA: Insufficient documentation

## 2016-09-01 NOTE — ED Notes (Addendum)
Pt back from radiology. Pt's brother Jerilynn SomCalvin came by and checked on pt and left. Stated that we could call him if we need anything.

## 2016-09-01 NOTE — ED Notes (Signed)
Pt d/c back to [facility]. Pt appears to be in NAD. VSS. Pt transported via [facility].

## 2016-09-01 NOTE — ED Provider Notes (Signed)
[facility]-EMERGENCY DEPT Provider Note   CSN: [id number] Arrival date & time: 09/01/16  1833     History   Chief Complaint Chief Complaint  Patient presents with  . Fall    HPI Glenda Coleman is a 71 y.o. female.  HPI Level V caveat due to severe dementia. Patient presents from nursing home by EMS. Witnessed fall from wheelchair. No loss of consciousness. Questionable left hip pain though difficult to assess. Patient is at her baseline mental status. Past Medical History:  Diagnosis Date  . Alzheimer disease   . Arthritis    "back" (08/27/2015)  . Chronic lower back pain   . Dementia    "severe" (08/27/2015)  . Depression   . Hypertension   . Stroke (HCC) 08/27/2015  . TIA (transient ischemic attack) dx'd 2015   "dr said she'd had a few; put her on aspirin at that point"  . Type II diabetes mellitus [facility])     Patient Active Problem List   Diagnosis Date Noted  . Encephalopathy 01/05/2016  . Hypernatremia 01/05/2016  . AKI (acute kidney injury) (HCC) 01/05/2016  . DNR (do not resuscitate)   . Palliative care encounter   . Goals of care, counseling/discussion   . Acute CVA (cerebrovascular accident) (HCC) 08/28/2015  . Stroke (cerebrum) (HCC)   . Dysphagia, pharyngoesophageal phase   . Essential hypertension   . HLD (hyperlipidemia)   . Dementia   . CVA (cerebral vascular accident) (HCC) 08/27/2015    Past Surgical History:  Procedure Laterality Date  . ABDOMINAL HYSTERECTOMY    . PARTIAL KNEE ARTHROPLASTY Left     OB History    No data available       Home Medications    Prior to Admission medications   Medication Sig Start Date End Date Taking? Authorizing Provider  acetaminophen (TYLENOL) 500 MG tablet Take 500 mg by mouth every 4 (four) hours as needed for mild pain, moderate pain, fever or headache.     [provider]  aspirin EC 81 MG EC tablet Take 1 tablet (81 mg total) by mouth daily. Patient taking differently: Take 81 mg by mouth daily  with breakfast.  09/02/15   Fuller Plan, MD  atorvastatin (LIPITOR) 20 MG tablet Take 20 mg by mouth at bedtime.    [provider]  Biotin 5 MG TABS Take 5 mg by mouth daily with breakfast.     [provider]  calcium-vitamin D (OSCAL WITH D) 500-200 MG-UNIT tablet Take 1 tablet by mouth 2 (two) times daily.    [provider]  divalproex (DEPAKOTE SPRINKLE) 125 MG capsule Take 250 mg by mouth 2 (two) times daily.    [provider]  donepezil (ARICEPT) 10 MG tablet Take 20 mg by mouth daily with breakfast.  09/24/14   [provider]  guaifenesin (ROBAFEN) 100 MG/5ML syrup Take 200 mg by mouth every 6 (six) hours as needed for cough.    [provider]  lisinopril (PRINIVIL,ZESTRIL) 5 MG tablet Take 5 mg by mouth daily with breakfast.  09/08/14   [provider]  memantine (NAMENDA) 10 MG tablet Take 10 mg by mouth daily with breakfast.  09/30/14   [provider]  Multiple Vitamin (MULTIVITAMIN WITH MINERALS) TABS tablet Take 1 tablet by mouth every morning.    [provider]  neomycin-bacitracin-polymyxin (NEOSPORIN) ointment Apply 1 application topically as needed for wound care.    [provider]  omega-3 acid ethyl esters (LOVAZA) 1 g  capsule Take 2 g by mouth every morning.    [provider]  oseltamivir (TAMIFLU) 75 MG capsule Take 75 mg by mouth daily. 04/18/16   [provider]  PRESCRIPTION MEDICATION Apply 0.5 mLs topically every 8 (eight) hours as needed (for agitation). Apply to wrist every 8 hours as needed for agitation --- R- Lorazepam PLO Gel 1mg /ml    [provider]  risperiDONE (RISPERDAL) 1 MG tablet Take 1 tablet (1 mg total) by mouth as directed. Take 0.5mg  qAM and 1mg  qHS Patient taking differently: Take 1 mg by mouth at bedtime.  09/02/15   Fuller Plan, MD  sertraline (ZOLOFT) 100 MG tablet Take 100 mg by mouth daily with breakfast.     [provider]    Family History History reviewed. No pertinent family history.  Social History Social History  Substance Use Topics  . Smoking status: Former Smoker    Years: 25.00    Types: Cigarettes  . Smokeless tobacco: Former Neurosurgeon    Types: Chew     Comment: "stoped smoking cigarettes & chewng in the 1980s"  . Alcohol use No     Comment: 08/27/2015 "quit in the 1970s"     Allergies   Patient has no known allergies.   Review of Systems Review of Systems  Unable to perform ROS: Dementia     Physical Exam Updated Vital Signs BP 118/77   Pulse 69   Temp 98.2 F (36.8 C) (Oral)   Resp 12   Ht 5\' 5"  (1.651 m)   Wt 76.7 kg (169 lb)   SpO2 100%   BMI 28.12 kg/m   Physical Exam  Constitutional: She appears well-developed and well-nourished.  HENT:  Head: Normocephalic and atraumatic.  Mouth/Throat: Oropharynx is clear and moist.  No obvious head injury.  Eyes: EOM are normal. Pupils are equal, round, and reactive to light.  Neck: Normal range of motion. Neck supple.  No apparent tenderness to palpation of the posterior cervical spine  Cardiovascular: Normal rate and regular rhythm.   Pulmonary/Chest: Effort normal and breath sounds normal.  Abdominal: Soft. Bowel sounds are normal. There is no tenderness. There is no rebound and no guarding.  Musculoskeletal: Normal range of motion. She exhibits no edema or tenderness.  Questional tenderness with palpation of the lateral left hip. No definite midline thoracic or lumbar tenderness. Distal pulses appear intact.  Neurological: She is alert.  Patient is nonverbal. She is off on commands. She screams out but does not appear to be in any distress. Witnessed moving all extremities.  Skin: Skin is warm and dry. Capillary refill takes less than 2 seconds. No rash noted. No erythema.  Psychiatric: She has a normal mood and affect. Her behavior is normal.  Nursing note and vitals reviewed.    ED Treatments / Results    Labs (all labs ordered are listed, but only abnormal results are displayed) Labs Reviewed - No data to display  EKG  EKG Interpretation  Date/Time:  Friday September 01 2016 18:48:39 EDT Ventricular Rate:  80 PR Interval:    QRS Duration: 93 QT Interval:  456 QTC Calculation: 527 R Axis:   26 Text Interpretation:  Sinus rhythm Short PR interval Nonspecific repol abnormality, diffuse leads Prolonged QT interval Confirmed by Ranae Palms  MD, Mikah Poss ([id number]) on 09/01/2016 9:17:22 PM       Radiology Ct Head Wo Contrast  Result Date: 09/01/2016 CLINICAL DATA:  71 year old female with fall. EXAM: CT HEAD WITHOUT CONTRAST  CT CERVICAL SPINE WITHOUT CONTRAST TECHNIQUE: Multidetector CT imaging of the head and cervical spine was performed following the standard protocol without intravenous contrast. Multiplanar CT image reconstructions of the cervical spine were also generated. COMPARISON:  Head CT dated 04/21/2016 FINDINGS: CT HEAD FINDINGS Brain: There is advanced age-related atrophy and chronic microvascular ischemic changes. There is dilatation of the ventricles out of proportion with the sulci which may represent central volume loss versus normal pressure hydrocephalus. Clinical correlation is recommended. There is a large area of old infarct and encephalomalacia in the left parietal lobe. Small cerebellar infarcts and encephalomalacia noted. There is no acute intracranial hemorrhage. No mass effect or midline shift noted. No extra-axial fluid collection. Vascular: No hyperdense vessel or unexpected calcification. Skull: Normal. Negative for fracture or focal lesion. Sinuses/Orbits: There is mild diffuse mucoperiosteal thickening of paranasal sinuses with near complete opacification of the right maxillary sinus. No air-fluid levels. The remainder of the visualized paranasal sinuses and mastoid air cells are clear. Other: None CT CERVICAL SPINE FINDINGS Alignment: No acute subluxation. There is straightening  of normal cervical lordosis which may be positional or due to muscle spasm. Skull base and vertebrae: No acute fracture. Soft tissues and spinal canal: No prevertebral fluid or swelling. No visible canal hematoma. Disc levels: There is degenerative changes with disc space narrowing and endplate irregularity and anterior osteophyte at C5-C6. Upper chest: There is a 6 x 8 mm ground-glass nodular density in the right apical lung similar to the study of 12/24/2015. This may represent an area of scarring. A indolent/ slow growing neoplastic process is not entirely excluded. Nonemergent chest CT is recommended for complete evaluation of the lungs. Other: None IMPRESSION: 1. No acute intracranial hemorrhage. 2. Moderate age-related atrophy and chronic microvascular ischemic changes. Old left parietal infarct and encephalomalacia. 3. No acute/traumatic cervical spine pathology. Degenerative changes at C5-C6. 4. Ground-glass nodular density in the right apical lung stable since the study of October 2017. Nonemergent CT of the chest is recommended for complete evaluation of the lungs. Electronically Signed   By: Elgie CollardArash  Radparvar M.D.   On: 09/01/2016 20:18   Ct Cervical Spine Wo Contrast  Result Date: 09/01/2016 CLINICAL DATA:  71 year old female with fall. EXAM: CT HEAD WITHOUT CONTRAST CT CERVICAL SPINE WITHOUT CONTRAST TECHNIQUE: Multidetector CT imaging of the head and cervical spine was performed following the standard protocol without intravenous contrast. Multiplanar CT image reconstructions of the cervical spine were also generated. COMPARISON:  Head CT dated 04/21/2016 FINDINGS: CT HEAD FINDINGS Brain: There is advanced age-related atrophy and chronic microvascular ischemic changes. There is dilatation of the ventricles out of proportion with the sulci which may represent central volume loss versus normal pressure hydrocephalus. Clinical correlation is recommended. There is a large area of old infarct and  encephalomalacia in the left parietal lobe. Small cerebellar infarcts and encephalomalacia noted. There is no acute intracranial hemorrhage. No mass effect or midline shift noted. No extra-axial fluid collection. Vascular: No hyperdense vessel or unexpected calcification. Skull: Normal. Negative for fracture or focal lesion. Sinuses/Orbits: There is mild diffuse mucoperiosteal thickening of paranasal sinuses with near complete opacification of the right maxillary sinus. No air-fluid levels. The remainder of the visualized paranasal sinuses and mastoid air cells are clear. Other: None CT CERVICAL SPINE FINDINGS Alignment: No acute subluxation. There is straightening of normal cervical lordosis which may be positional or due to muscle spasm. Skull base and vertebrae: No acute fracture. Soft tissues and spinal canal: No prevertebral fluid or swelling.  No visible canal hematoma. Disc levels: There is degenerative changes with disc space narrowing and endplate irregularity and anterior osteophyte at C5-C6. Upper chest: There is a 6 x 8 mm ground-glass nodular density in the right apical lung similar to the study of 12/24/2015. This may represent an area of scarring. A indolent/ slow growing neoplastic process is not entirely excluded. Nonemergent chest CT is recommended for complete evaluation of the lungs. Other: None IMPRESSION: 1. No acute intracranial hemorrhage. 2. Moderate age-related atrophy and chronic microvascular ischemic changes. Old left parietal infarct and encephalomalacia. 3. No acute/traumatic cervical spine pathology. Degenerative changes at C5-C6. 4. Ground-glass nodular density in the right apical lung stable since the study of October 2017. Nonemergent CT of the chest is recommended for complete evaluation of the lungs. Electronically Signed   By: Elgie Collard M.D.   On: 09/01/2016 20:18   Dg Hip Unilat W Or Wo Pelvis 2-3 Views Left  Result Date: 09/01/2016 CLINICAL DATA:  Status post fall  from wheelchair, with left leg pain. Initial encounter. EXAM: DG HIP (WITH OR WITHOUT PELVIS) 2-3V LEFT COMPARISON:  None. FINDINGS: There is no evidence of fracture or dislocation. Slight cortical irregularity about the left femoral neck is thought to reflect osteophyte formation. Both femoral heads are seated normally within their respective acetabula. The proximal left femur appears intact. Mild degenerative change is noted at the lower lumbar spine. The sacroiliac joints are unremarkable in appearance. The visualized bowel gas pattern is grossly unremarkable in appearance. IMPRESSION: No definite evidence of fracture or dislocation. Slight cortical irregularity about the left femoral neck is thought to reflect underlying osteophyte formation. Electronically Signed   By: Roanna Raider M.D.   On: 09/01/2016 20:07    Procedures Procedures (including critical care time)  Medications Ordered in ED Medications - No data to display   Initial Impression / Assessment and Plan / ED Course  I have reviewed the triage vital signs and the nursing notes.  Pertinent labs & imaging results that were available during my care of the patient were reviewed by me and considered in my medical decision making (see chart for details).    No acute head or neck injury. No hip fracture on x-ray. Patient is not ambulatory. She is at her baseline mental status. Will discharge back to nursing home. EKG similar to previous.    Final Clinical Impressions(s) / ED Diagnoses   Final diagnoses:  Fall, initial encounter    New Prescriptions New Prescriptions   No medications on file     Loren Racer, MD 09/01/16 [id number]

## 2016-09-01 NOTE — ED Triage Notes (Signed)
Per EMS, pt from [facility]. Pt fell from wheelchair. Pt has hx of CVA, dementia, alzheimers, pt only communicates through "whaling and screaming" Nursing staff stated that fall was witnessed and no LOC. No obvious injuries. Pt screams when left leg is touched. VS 110 systolic, HR 72, RR 18, CBG 112, spo2 95% on 4L. Pt does not wear o2 at home. 12 lead showed inverted t-waves inferior, low lateral and anterior. 15 lead did not show any abnormalilities.

## 2016-09-01 NOTE — ED Notes (Signed)
Pt taken to xray/ct 

## 2016-09-05 ENCOUNTER — Emergency Department (HOSPITAL_COMMUNITY): Payer: Medicare Other

## 2016-09-05 ENCOUNTER — Inpatient Hospital Stay (HOSPITAL_COMMUNITY)
Admission: EM | Admit: 2016-09-05 | Discharge: 2016-09-10 | DRG: 682 | Disposition: A | Discharge status: Hospice | Payer: Medicare Other | Attending: Family Medicine | Admitting: Family Medicine

## 2016-09-05 ENCOUNTER — Encounter (HOSPITAL_COMMUNITY): Payer: Self-pay | Admitting: Emergency Medicine

## 2016-09-05 DIAGNOSIS — Z8673 Personal history of transient ischemic attack (TIA), and cerebral infarction without residual deficits: Secondary | ICD-10-CM

## 2016-09-05 DIAGNOSIS — E875 Hyperkalemia: Secondary | ICD-10-CM | POA: Diagnosis present

## 2016-09-05 DIAGNOSIS — E860 Dehydration: Secondary | ICD-10-CM | POA: Diagnosis not present

## 2016-09-05 DIAGNOSIS — E785 Hyperlipidemia, unspecified: Secondary | ICD-10-CM | POA: Diagnosis present

## 2016-09-05 DIAGNOSIS — Z96652 Presence of left artificial knee joint: Secondary | ICD-10-CM | POA: Diagnosis present

## 2016-09-05 DIAGNOSIS — Z66 Do not resuscitate: Secondary | ICD-10-CM | POA: Diagnosis present

## 2016-09-05 DIAGNOSIS — G309 Alzheimer's disease, unspecified: Secondary | ICD-10-CM | POA: Diagnosis present

## 2016-09-05 DIAGNOSIS — F0390 Unspecified dementia without behavioral disturbance: Secondary | ICD-10-CM | POA: Diagnosis present

## 2016-09-05 DIAGNOSIS — Z7982 Long term (current) use of aspirin: Secondary | ICD-10-CM

## 2016-09-05 DIAGNOSIS — F0280 Dementia in other diseases classified elsewhere without behavioral disturbance: Secondary | ICD-10-CM | POA: Diagnosis present

## 2016-09-05 DIAGNOSIS — E870 Hyperosmolality and hypernatremia: Secondary | ICD-10-CM | POA: Diagnosis present

## 2016-09-05 DIAGNOSIS — I10 Essential (primary) hypertension: Secondary | ICD-10-CM | POA: Diagnosis present

## 2016-09-05 DIAGNOSIS — I639 Cerebral infarction, unspecified: Secondary | ICD-10-CM | POA: Diagnosis present

## 2016-09-05 DIAGNOSIS — Z515 Encounter for palliative care: Secondary | ICD-10-CM | POA: Diagnosis present

## 2016-09-05 DIAGNOSIS — G9341 Metabolic encephalopathy: Secondary | ICD-10-CM | POA: Diagnosis present

## 2016-09-05 DIAGNOSIS — Z87891 Personal history of nicotine dependence: Secondary | ICD-10-CM

## 2016-09-05 DIAGNOSIS — N179 Acute kidney failure, unspecified: Secondary | ICD-10-CM | POA: Diagnosis not present

## 2016-09-05 HISTORY — DX: Dehydration: E86.0

## 2016-09-05 HISTORY — DX: Hyperosmolality and hypernatremia: E87.0

## 2016-09-05 LAB — URINALYSIS, ROUTINE W REFLEX MICROSCOPIC
Bilirubin Urine: NEGATIVE
Glucose, UA: NEGATIVE mg/dL
Hgb urine dipstick: NEGATIVE
Ketones, ur: 5 mg/dL — AB
Leukocytes, UA: NEGATIVE
Nitrite: NEGATIVE
Protein, ur: 100 mg/dL — AB
Specific Gravity, Urine: 1.028 (ref 1.005–1.030)
pH: 5 (ref 5.0–8.0)

## 2016-09-05 LAB — CBC WITH DIFFERENTIAL/PLATELET
Basophils Absolute: 0 10*3/uL (ref 0.0–0.1)
Basophils Relative: 0 %
Eosinophils Absolute: 0 10*3/uL (ref 0.0–0.7)
Eosinophils Relative: 0 %
HCT: 46.1 % — ABNORMAL HIGH (ref 36.0–46.0)
Hemoglobin: 13.2 g/dL (ref 12.0–15.0)
Lymphocytes Relative: 21 %
Lymphs Abs: 1.7 10*3/uL (ref 0.7–4.0)
MCH: 28.3 pg (ref 26.0–34.0)
MCHC: 28.6 g/dL — ABNORMAL LOW (ref 30.0–36.0)
MCV: 98.7 fL (ref 78.0–100.0)
Monocytes Absolute: 1.1 10*3/uL — ABNORMAL HIGH (ref 0.1–1.0)
Monocytes Relative: 14 %
Neutro Abs: 5.3 10*3/uL (ref 1.7–7.7)
Neutrophils Relative %: 65 %
Platelets: 151 10*3/uL (ref 150–400)
RBC: 4.67 MIL/uL (ref 3.87–5.11)
RDW: 15.7 % — ABNORMAL HIGH (ref 11.5–15.5)
WBC: 8.1 10*3/uL (ref 4.0–10.5)

## 2016-09-05 LAB — BASIC METABOLIC PANEL
BUN: 84 mg/dL — ABNORMAL HIGH (ref 6–20)
CO2: 28 mmol/L (ref 22–32)
Calcium: 9.2 mg/dL (ref 8.9–10.3)
Chloride: >130 mmol/L (ref 101–111)
Creatinine, Ser: 2.39 mg/dL — ABNORMAL HIGH (ref 0.44–1.00)
GFR calc Af Amer: 22 mL/min — ABNORMAL LOW (ref >60)
GFR calc non Af Amer: 19 mL/min — ABNORMAL LOW (ref >60)
Glucose, Bld: 94 mg/dL (ref 65–99)
Potassium: 4.4 mmol/L (ref 3.5–5.1)
Sodium: 178 mmol/L (ref 135–145)

## 2016-09-05 LAB — MAGNESIUM: MAGNESIUM: 3 mg/dL — AB (ref 1.7–2.4)

## 2016-09-05 LAB — PHOSPHORUS: PHOSPHORUS: 4.6 mg/dL (ref 2.5–4.6)

## 2016-09-05 MED ORDER — HEPARIN SODIUM (PORCINE) 5000 UNIT/ML IJ SOLN
5000.0000 [IU] | Freq: Three times a day (TID) | INTRAMUSCULAR | Status: DC
  Administered 2016-09-06 – 2016-09-10 (×14): 5000 [IU] via SUBCUTANEOUS
  Filled 2016-09-05 (×14): qty 1

## 2016-09-05 MED ORDER — RISPERIDONE 0.5 MG PO TABS
0.5000 mg | ORAL_TABLET | Freq: Every day | ORAL | Status: DC
  Administered 2016-09-06 – 2016-09-09 (×5): 0.5 mg via ORAL
  Filled 2016-09-05 (×6): qty 1

## 2016-09-05 MED ORDER — SERTRALINE HCL 50 MG PO TABS
100.0000 mg | ORAL_TABLET | Freq: Every day | ORAL | Status: DC
  Administered 2016-09-06 – 2016-09-09 (×4): 100 mg via ORAL
  Filled 2016-09-05 (×4): qty 2

## 2016-09-05 MED ORDER — SODIUM CHLORIDE 0.9 % IV BOLUS (SEPSIS): 2000.0000 mL | Freq: Once | INTRAVENOUS | Status: DC

## 2016-09-05 MED ORDER — SODIUM CHLORIDE 0.9 % IV BOLUS (SEPSIS)
1000.0000 mL | Freq: Once | INTRAVENOUS | Status: AC
  Administered 2016-09-05: 1000 mL via INTRAVENOUS

## 2016-09-05 MED ORDER — MEMANTINE HCL 10 MG PO TABS
10.0000 mg | ORAL_TABLET | Freq: Every day | ORAL | Status: DC
  Administered 2016-09-06 – 2016-09-09 (×4): 10 mg via ORAL
  Filled 2016-09-05 (×4): qty 1

## 2016-09-05 MED ORDER — ASPIRIN EC 81 MG PO TBEC
81.0000 mg | DELAYED_RELEASE_TABLET | Freq: Every day | ORAL | Status: DC
  Administered 2016-09-06 – 2016-09-09 (×4): 81 mg via ORAL
  Filled 2016-09-05 (×4): qty 1

## 2016-09-05 MED ORDER — DONEPEZIL HCL 10 MG PO TABS
20.0000 mg | ORAL_TABLET | Freq: Every day | ORAL | Status: DC
  Administered 2016-09-06 – 2016-09-08 (×4): 20 mg via ORAL
  Filled 2016-09-05 (×4): qty 2

## 2016-09-05 MED ORDER — DEXTROSE-NACL 5-0.45 % IV SOLN
INTRAVENOUS | Status: DC
  Administered 2016-09-05: 15:00:00 via INTRAVENOUS

## 2016-09-05 MED ORDER — ASPIRIN 81 MG PO TBEC: 81.0000 mg | DELAYED_RELEASE_TABLET | Freq: Every day | ORAL | Status: DC

## 2016-09-05 MED ORDER — ATORVASTATIN CALCIUM 20 MG PO TABS
20.0000 mg | ORAL_TABLET | Freq: Every day | ORAL | Status: DC
  Administered 2016-09-06 – 2016-09-08 (×4): 20 mg via ORAL
  Filled 2016-09-05 (×4): qty 1

## 2016-09-05 MED ORDER — DEXTROSE-NACL 5-0.45 % IV SOLN
INTRAVENOUS | Status: DC
  Administered 2016-09-06 (×2): via INTRAVENOUS

## 2016-09-05 MED ORDER — DIVALPROEX SODIUM 125 MG PO CSDR
250.0000 mg | DELAYED_RELEASE_CAPSULE | Freq: Two times a day (BID) | ORAL | Status: DC
  Administered 2016-09-06 (×2): 250 mg via ORAL
  Filled 2016-09-05 (×3): qty 2

## 2016-09-05 NOTE — ED Triage Notes (Signed)
Pt arrives via [facility] from [facility] for c/o general decline over the past week. Pt presents with left sided gaze, bp low en route. Pt is only responsive to pain. Hx of dementia and stroke. Was seen here 4 days ago for fall.

## 2016-09-05 NOTE — ED Notes (Signed)
Patrice at [facility]

## 2016-09-05 NOTE — ED Provider Notes (Signed)
[facility]-EMERGENCY DEPT Provider Note    By signing my name below, I, Earmon Phoenix, attest that this documentation has been prepared under the direction and in the presence of Raeford Razor, MD. Electronically Signed: Earmon Phoenix, ED Scribe. 09/05/16. 2:49 PM.    History   Chief Complaint Chief Complaint  Patient presents with  . Altered Mental Status  . Hypotension   LEVEL 5 CAVEAT- Full history could not be obtained due to AMS.  The history is provided by the EMS personnel, medical records and the nursing home. No language interpreter was used.    HPI Comments:  Glenda Coleman is a 71 y.o. female with PMHx of alzheimer's disease with severe dementia, chronic low back pain, CVA, HTN, T2DM brought in by EMS from [facility], who presents to the Emergency Department due to a general decline in health in the past week. EMS reports low BP en route. Nursing home staff states pt was seen here for a fall four days ago and has not improved since.     Past Medical History:  Diagnosis Date  . Alzheimer disease   . Arthritis    "back" (08/27/2015)  . Chronic lower back pain   . Dementia    "severe" (08/27/2015)  . Depression   . Hypertension   . Stroke (HCC) 08/27/2015  . TIA (transient ischemic attack) dx'd 2015   "dr said she'd had a few; put her on aspirin at that point"  . Type II diabetes mellitus [facility])     Patient Active Problem List   Diagnosis Date Noted  . Encephalopathy 01/05/2016  . Hypernatremia 01/05/2016  . AKI (acute kidney injury) (HCC) 01/05/2016  . DNR (do not resuscitate)   . Palliative care encounter   . Goals of care, counseling/discussion   . Acute CVA (cerebrovascular accident) (HCC) 08/28/2015  . Stroke (cerebrum) (HCC)   . Dysphagia, pharyngoesophageal phase   . Essential hypertension   . HLD (hyperlipidemia)   . Dementia   . CVA (cerebral vascular accident) (HCC) 08/27/2015    Past Surgical History:  Procedure Laterality Date  .  ABDOMINAL HYSTERECTOMY    . PARTIAL KNEE ARTHROPLASTY Left     OB History    No data available       Home Medications    Prior to Admission medications   Medication Sig Start Date End Date Taking? Authorizing Provider  acetaminophen (TYLENOL) 500 MG tablet Take 500 mg by mouth every 4 (four) hours as needed for mild pain, moderate pain, fever or headache.    Yes [provider]  aspirin EC 81 MG EC tablet Take 1 tablet (81 mg total) by mouth daily. Patient taking differently: Take 81 mg by mouth daily with breakfast.  09/02/15  Yes Rice, Jamesetta Orleans, MD  atorvastatin (LIPITOR) 20 MG tablet Take 20 mg by mouth at bedtime.   Yes [provider]  Biotin 5 MG TABS Take 5 mg by mouth daily with breakfast.    Yes [provider]  calcium-vitamin D (OSCAL WITH D) 500-200 MG-UNIT tablet Take 1 tablet by mouth 2 (two) times daily.   Yes [provider]  divalproex (DEPAKOTE SPRINKLE) 125 MG capsule Take 250 mg by mouth 2 (two) times daily.   Yes [provider]  donepezil (ARICEPT) 10 MG tablet Take 20 mg by mouth at bedtime.  09/24/14  Yes [provider]  guaifenesin (ROBAFEN) 100 MG/5ML syrup Take 200 mg by mouth every 6 (six) hours as needed for cough.  Yes [provider]  lisinopril (PRINIVIL,ZESTRIL) 5 MG tablet Take 5 mg by mouth daily with breakfast.  09/08/14  Yes [provider]  memantine (NAMENDA) 10 MG tablet Take 10 mg by mouth daily with breakfast.  09/30/14  Yes [provider]  Multiple Vitamin (MULTIVITAMIN WITH MINERALS) TABS tablet Take 1 tablet by mouth every morning.   Yes [provider]  neomycin-bacitracin-polymyxin (NEOSPORIN) ointment Apply 1 application topically as needed for wound care.   Yes [provider]  PRESCRIPTION MEDICATION Apply 0.5 mLs topically every 8 (eight) hours as needed (for agitation). Apply to wrist every 8 hours as needed for agitation --- R-  Lorazepam PLO Gel 1mg /ml   Yes [provider]  risperiDONE (RISPERDAL) 1 MG tablet Take 1 tablet (1 mg total) by mouth as directed. Take 0.5mg  qAM and 1mg  qHS Patient taking differently: Take 0.5 mg by mouth at bedtime.  09/02/15  Yes Rice, Jamesetta Orleans, MD  sertraline (ZOLOFT) 100 MG tablet Take 100 mg by mouth daily with breakfast.    Yes [provider]    Family History No family history on file.  Social History Social History  Substance Use Topics  . Smoking status: Former Smoker    Years: 25.00    Types: Cigarettes  . Smokeless tobacco: Former Neurosurgeon    Types: Chew     Comment: "stoped smoking cigarettes & chewng in the 1980s"  . Alcohol use No     Comment: 08/27/2015 "quit in the 1970s"     Allergies   Patient has no known allergies.   Review of Systems Review of Systems  Unable to perform ROS: Mental status change   LEVEL 5 CAVEAT- Full history could not be obtained due to AMS.   Physical Exam Updated Vital Signs BP (!) 104/93   Pulse 64   Temp (S) (!) 96.9 F (36.1 C) (Rectal)   Resp 13   SpO2 98%   Physical Exam  Constitutional: She appears well-developed and well-nourished. No distress.  Lying on her side. Left sided gaze.  HENT:  Head: Normocephalic and atraumatic.  Eyes: EOM are normal.  Neck: Normal range of motion.  Cardiovascular: Normal rate, regular rhythm and normal heart sounds.   Pulmonary/Chest: Effort normal and breath sounds normal.  Abdominal: Soft. She exhibits no distension.  Sometimes moans inconsistently with palpation of abdomen.  Musculoskeletal: Normal range of motion.  Neurological:  Moans with pain. Withdraws all extremities to pain.  Skin: Skin is warm and dry.  Nursing note and vitals reviewed.    ED Treatments / Results  DIAGNOSTIC STUDIES:    COORDINATION OF CARE: 10:29 AM- Pt verbalizes understanding and agrees to plan.  Medications - No data to display  Labs (all labs ordered are listed,  but only abnormal results are displayed) Labs Reviewed  URINALYSIS, ROUTINE W REFLEX MICROSCOPIC - Abnormal; Notable for the following:       Result Value   Color, Urine AMBER (*)    APPearance CLOUDY (*)    Ketones, ur 5 (*)    Protein, ur 100 (*)    Bacteria, UA RARE (*)    Squamous Epithelial / LPF 6-30 (*)    All other components within normal limits  CBC WITH DIFFERENTIAL/PLATELET - Abnormal; Notable for the following:    HCT 46.1 (*)    MCHC 28.6 (*)    RDW 15.7 (*)    Monocytes Absolute 1.1 (*)    All other components within normal limits  BASIC METABOLIC PANEL - Abnormal; Notable for the following:    Sodium 178 (*)    Chloride >130 (*)    BUN 84 (*)    Creatinine, Ser 2.39 (*)    GFR calc non Af Amer 19 (*)    GFR calc Af Amer 22 (*)    All other components within normal limits    EKG  EKG Interpretation None       Radiology Ct Abdomen Pelvis Wo Contrast  Result Date: 09/05/2016 CLINICAL DATA:  Acute renal failure. Evaluate for obstructive uropathy. EXAM: CT ABDOMEN AND PELVIS WITHOUT CONTRAST TECHNIQUE: Multidetector CT imaging of the abdomen and pelvis was performed following the standard protocol without IV contrast. COMPARISON:  None. FINDINGS: Study is limited by patient's scanning with arms over the abdomen. Lower chest: Atelectasis noted right middle and lower lobes. 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 Hepatobiliary: No focal abnormality in the liver on this study without intravenous contrast. There is no evidence for gallstones, gallbladder wall thickening, or pericholecystic fluid. No intrahepatic or extrahepatic biliary dilation. Pancreas: No focal mass lesion. No dilatation of the main duct. No intraparenchymal cyst. No peripancreatic edema. Spleen: No splenomegaly. No focal mass lesion. Adrenals/Urinary Tract: 2 mm nonobstructing stone identified in the right kidney. No right hydronephrosis. No right ureteral stone. No right  hydroureter. 13 mm lesion in the interpolar left kidney approaches water attenuation and is likely a cyst. No evidence for left hydroureteronephrosis. No left-sided urinary stone disease. Bladder is decompressed without evidence of stones. Stomach/Bowel: Stomach is nondistended. No gastric wall thickening. No evidence of outlet obstruction. Duodenum is normally positioned as is the ligament of Treitz. No small bowel wall thickening. No small bowel dilatation. The terminal ileum is normal. The appendix is normal. Diverticular changes are noted in the left colon without evidence of diverticulitis. Vascular/Lymphatic: No abdominal aortic aneurysm. No abdominal aortic atherosclerotic calcification. There is no gastrohepatic or hepatoduodenal ligament lymphadenopathy. No intraperitoneal or retroperitoneal lymphadenopathy. No pelvic sidewall lymphadenopathy. Reproductive: Calcified lesion in the left adnexal space may be an exophytic fibroid or ovarian lesion. Other: No intraperitoneal free fluid. Musculoskeletal: Bone windows reveal no worrisome lytic or sclerotic osseous lesions. IMPRESSION: 1. No acute findings in the abdomen or pelvis. No evidence for hydroureteronephrosis. 2. 2 mm nonobstructing stone in the right kidney with probable small left renal cyst. 3. 3.7 cm calcified left adnexal lesion may be an exophytic fibroid or ovarian process. Pelvic ultrasound could be used to further evaluate as clinically warranted. 4. 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 Electronically Signed   By: Kennith CenterEric  Mansell M.D.   On: 09/05/2016 15:57   Ct Head Wo Contrast  Result Date: 09/05/2016 CLINICAL DATA:  Mental status decline over the last week. EXAM: CT HEAD WITHOUT CONTRAST TECHNIQUE: Contiguous axial images were obtained from the base of the skull through the vertex without intravenous contrast. COMPARISON:  09/01/2016 an multiple previous FINDINGS: Brain: No change since the previous study. Generalized  brain atrophy. Old small vessel cerebellar infarctions bilaterally. Advanced chronic small-vessel ischemic change of the cerebral hemispheric white matter with ex vacuo enlargement of lateral ventricles. Normal pressure hydrocephalus is not excluded by imaging. Old left MCA territory infarction with atrophy encephalomalacia. Vascular: There is atherosclerotic calcification of the major vessels at the base of the brain. Skull: Negative Sinuses/Orbits: Clear/normal Other: None significant IMPRESSION: No acute finding by CT. Atrophy an extensive chronic small vessel ischemic changes. 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 Ventricular prominence is presumably  secondary to central atrophy. However, this is increasing over time. The possibility of normal pressure hydrocephalus is not excluded. Electronically Signed   By: Paulina Fusi M.D.   On: 09/05/2016 12:43    Procedures Procedures (including critical care time)  CRITICAL CARE Performed by: Raeford Razor Total critical care time: 35 minutes Critical care time was exclusive of separately billable procedures and treating other patients. Critical care was necessary to treat or prevent imminent or life-threatening deterioration. Critical care was time spent personally by me on the following activities: development of treatment plan with patient and/or surrogate as well as nursing, discussions with consultants, evaluation of patient's response to treatment, examination of patient, obtaining history from patient or surrogate, ordering and performing treatments and interventions, ordering and review of laboratory studies, ordering and review of radiographic studies, pulse oximetry and re-evaluation of patient's condition.   Medications Ordered in ED Medications - No data to display   Initial Impression / Assessment and Plan / ED Course  I have reviewed the triage vital signs and the nursing notes.  Pertinent labs & imaging results that were available during  my care of the patient were reviewed by me and considered in my medical decision making (see chart for details).     70yF with decreased mental status from her baseline. Apparently severe dementia at baseline. No reported trauma. No external signs of trauma on exam.  Doesn't follow commands but moves all extremities herself. Does not seem to like stimuli but doesn't seemed distressed otherwise. W/u significant for severe hypernatremia/hyperchloremia/AKI. Appears to have had similar presentation 12/2015. CT a/p w/o signs of urologic obstruction. Got a NS bolus and will run d51/2. Admit. DNR per records.   Final Clinical Impressions(s) / ED Diagnoses   Final diagnoses:  Dehydration  AKI (acute kidney injury) (HCC)  Hypernatremia    New Prescriptions New Prescriptions   No medications on file    I personally preformed the services scribed in my presence. The recorded information has been reviewed is accurate. Raeford Razor, MD.     Raeford Razor, MD 09/05/16 [phone]

## 2016-09-05 NOTE — ED Notes (Signed)
Family at bedside. 

## 2016-09-05 NOTE — ED Notes (Signed)
ED Provider at bedside. 

## 2016-09-05 NOTE — ED Notes (Signed)
ED Provider at bedside speaking with patient's brother

## 2016-09-05 NOTE — ED Notes (Signed)
Patient transported to CT 

## 2016-09-05 NOTE — H&P (Addendum)
History and Physical    Glenda Coleman WGN:[id number] DOB: 1945/10/03 DOA: 09/05/2016  PCP: Elinor Dodge, MD  Patient coming from: SNF  Chief Complaint: Altered mental status baseline  HPI: Glenda Coleman is a 71 y.o. female with medical history significant of prior stroke last June per my discussion with brother who is caretaker, hypertension, hyperlipidemia, Alzheimer's disease. Patient presents because she was altered above baseline per caregivers at [facility]. Patient is nonverbal at baseline. At skill nursing faciligy they reported the patient was not being her normal self. History is obtained from brother, ED physician and EMR.  ED Course: While in the ED patient was found to have hyperchloremia, elevated specific gravity, hypernatremia, and acute kidney injury. We are consulted for further evaluation recommendations.  Review of Systems: Unable to obtain secondary to nonverbal status  Past Medical History:  Diagnosis Date  . Alzheimer disease   . Arthritis    "back" (08/27/2015)  . Chronic lower back pain   . Dementia    "severe" (08/27/2015)  . Depression   . Hypertension   . Stroke (HCC) 08/27/2015  . TIA (transient ischemic attack) dx'd 2015   "dr said she'd had a few; put her on aspirin at that point"  . Type II diabetes mellitus (HCC)     Past Surgical History:  Procedure Laterality Date  . ABDOMINAL HYSTERECTOMY    . PARTIAL KNEE ARTHROPLASTY Left      reports that she has quit smoking. Her smoking use included Cigarettes. She quit after 25.00 years of use. She has quit using smokeless tobacco. Her smokeless tobacco use included Chew. She reports that she does not drink alcohol or use drugs.  No Known Allergies  Family history - Unable to obtain from patient as she is nonverbal - Brother did not report any family history of any diseases   Prior to Admission medications   Medication Sig Start Date End Date Taking? Authorizing Provider  acetaminophen  (TYLENOL) 500 MG tablet Take 500 mg by mouth every 4 (four) hours as needed for mild pain, moderate pain, fever or headache.    Yes [provider]  aspirin EC 81 MG EC tablet Take 1 tablet (81 mg total) by mouth daily. Patient taking differently: Take 81 mg by mouth daily with breakfast.  09/02/15  Yes Rice, Jamesetta Orleans, MD  atorvastatin (LIPITOR) 20 MG tablet Take 20 mg by mouth at bedtime.   Yes [provider]  Biotin 5 MG TABS Take 5 mg by mouth daily with breakfast.    Yes [provider]  calcium-vitamin D (OSCAL WITH D) 500-200 MG-UNIT tablet Take 1 tablet by mouth 2 (two) times daily.   Yes [provider]  divalproex (DEPAKOTE SPRINKLE) 125 MG capsule Take 250 mg by mouth 2 (two) times daily.   Yes [provider]  donepezil (ARICEPT) 10 MG tablet Take 20 mg by mouth at bedtime.  09/24/14  Yes [provider]  guaifenesin (ROBAFEN) 100 MG/5ML syrup Take 200 mg by mouth every 6 (six) hours as needed for cough.   Yes [provider]  lisinopril (PRINIVIL,ZESTRIL) 5 MG tablet Take 5 mg by mouth daily with breakfast.  09/08/14  Yes [provider]  memantine (NAMENDA) 10 MG tablet Take 10 mg by mouth daily with breakfast.  09/30/14  Yes [provider]  Multiple Vitamin (MULTIVITAMIN WITH MINERALS) TABS tablet Take 1 tablet by mouth every morning.   Yes [provider]  neomycin-bacitracin-polymyxin (NEOSPORIN) ointment Apply 1 application  topically as needed for wound care.   Yes [provider]  PRESCRIPTION MEDICATION Apply 0.5 mLs topically every 8 (eight) hours as needed (for agitation). Apply to wrist every 8 hours as needed for agitation --- R- Lorazepam PLO Gel 1mg /ml   Yes [provider]  risperiDONE (RISPERDAL) 1 MG tablet Take 1 tablet (1 mg total) by mouth as directed. Take 0.5mg  qAM and 1mg  qHS Patient taking differently: Take 0.5 mg by mouth at bedtime.  09/02/15  Yes Rice,  Jamesetta Orleans, MD  sertraline (ZOLOFT) 100 MG tablet Take 100 mg by mouth daily with breakfast.    Yes [provider]    Physical Exam: Vitals:   09/05/16 1700 09/05/16 1715 09/05/16 1730 09/05/16 1745  BP: 120/71 125/88 132/77 120/83  Pulse: (!) 53 (!) 54 61 (!) 55  Resp:      Temp:      TempSrc:      SpO2: 94% 99% 97% 99%    Constitutional: Patient is in no acute distress Vitals:   09/05/16 1700 09/05/16 1715 09/05/16 1730 09/05/16 1745  BP: 120/71 125/88 132/77 120/83  Pulse: (!) 53 (!) 54 61 (!) 55  Resp:      Temp:      TempSrc:      SpO2: 94% 99% 97% 99%   Eyes: PERRL, lids and conjunctivae normal ENMT: Dry Mucous membranes. Poor oral hygiene. Unable to fully examine well secondary to limited cooperation  Neck: normal, no thyromegaly Respiratory: clear to auscultation bilaterally, no wheezing, no crackles. Normal respiratory effort. No accessory muscle use.  Cardiovascular: Regular rate and rhythm, no murmurs / rubs / gallops.  Abdomen: no tenderness, no masses palpated. No hepatosplenomegaly. Bowel sounds positive. No rebound tenderness Musculoskeletal: Difficult exam given limited cooperation Skin: no rashes, lesions, ulcers. No induration, and limited exam Neurologic: Patient seems contracted, does not interact with examiner. Difficult exam given dementia and nonverbal status Psychiatric: Unable to assess as patient is nonverbal with poor cooperation   Labs on Admission: I have personally reviewed following labs and imaging studies  CBC:  Recent Labs Lab 09/05/16 1310  WBC 8.1  NEUTROABS 5.3  HGB 13.2  HCT 46.1*  MCV 98.7  PLT 151   Basic Metabolic Panel:  Recent Labs Lab 09/05/16 1310  NA 178*  K 4.4  CL >130*  CO2 28  GLUCOSE 94  BUN 84*  CREATININE 2.39*  CALCIUM 9.2   GFR: Estimated Creatinine Clearance: 21.8 mL/min (A) (by C-G formula based on SCr of 2.39 mg/dL (H)). Liver Function Tests: No results for input(s): AST, ALT,  ALKPHOS, BILITOT, PROT, ALBUMIN in the last 168 hours. No results for input(s): LIPASE, AMYLASE in the last 168 hours. No results for input(s): AMMONIA in the last 168 hours. Coagulation Profile: No results for input(s): INR, PROTIME in the last 168 hours. Cardiac Enzymes: No results for input(s): CKTOTAL, CKMB, CKMBINDEX, TROPONINI in the last 168 hours. BNP (last 3 results) No results for input(s): PROBNP in the last 8760 hours. HbA1C: No results for input(s): HGBA1C in the last 72 hours. CBG: No results for input(s): GLUCAP in the last 168 hours. Lipid Profile: No results for input(s): CHOL, HDL, LDLCALC, TRIG, CHOLHDL, LDLDIRECT in the last 72 hours. Thyroid Function Tests: No results for input(s): TSH, T4TOTAL, FREET4, T3FREE, THYROIDAB in the last 72 hours. Anemia Panel: No results for input(s): VITAMINB12, FOLATE, FERRITIN, TIBC, IRON, RETICCTPCT in the last 72 hours. Urine analysis:    Component Value Date/Time  COLORURINE AMBER (A) 09/05/2016 1531   APPEARANCEUR CLOUDY (A) 09/05/2016 1531   LABSPEC 1.028 09/05/2016 1531   PHURINE 5.0 09/05/2016 1531   GLUCOSEU NEGATIVE 09/05/2016 1531   HGBUR NEGATIVE 09/05/2016 1531   BILIRUBINUR NEGATIVE 09/05/2016 1531   KETONESUR 5 (A) 09/05/2016 1531   PROTEINUR 100 (A) 09/05/2016 1531   UROBILINOGEN 0.2 10/06/2014 1142   NITRITE NEGATIVE 09/05/2016 1531   LEUKOCYTESUR NEGATIVE 09/05/2016 1531    Radiological Exams on Admission: Ct Abdomen Pelvis Wo Contrast  Result Date: 09/05/2016 CLINICAL DATA:  Acute renal failure. Evaluate for obstructive uropathy. EXAM: CT ABDOMEN AND PELVIS WITHOUT CONTRAST TECHNIQUE: Multidetector CT imaging of the abdomen and pelvis was performed following the standard protocol without IV contrast. COMPARISON:  None. FINDINGS: Study is limited by patient's scanning with arms over the abdomen. Lower chest: Atelectasis noted right middle and lower lobes. 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 Hepatobiliary: No focal abnormality in the liver on this study without intravenous contrast. There is no evidence for gallstones, gallbladder wall thickening, or pericholecystic fluid. No intrahepatic or extrahepatic biliary dilation. Pancreas: No focal mass lesion. No dilatation of the main duct. No intraparenchymal cyst. No peripancreatic edema. Spleen: No splenomegaly. No focal mass lesion. Adrenals/Urinary Tract: 2 mm nonobstructing stone identified in the right kidney. No right hydronephrosis. No right ureteral stone. No right hydroureter. 13 mm lesion in the interpolar left kidney approaches water attenuation and is likely a cyst. No evidence for left hydroureteronephrosis. No left-sided urinary stone disease. Bladder is decompressed without evidence of stones. Stomach/Bowel: Stomach is nondistended. No gastric wall thickening. No evidence of outlet obstruction. Duodenum is normally positioned as is the ligament of Treitz. No small bowel wall thickening. No small bowel dilatation. The terminal ileum is normal. The appendix is normal. Diverticular changes are noted in the left colon without evidence of diverticulitis. Vascular/Lymphatic: No abdominal aortic aneurysm. No abdominal aortic atherosclerotic calcification. There is no gastrohepatic or hepatoduodenal ligament lymphadenopathy. No intraperitoneal or retroperitoneal lymphadenopathy. No pelvic sidewall lymphadenopathy. Reproductive: Calcified lesion in the left adnexal space may be an exophytic fibroid or ovarian lesion. Other: No intraperitoneal free fluid. Musculoskeletal: Bone windows reveal no worrisome lytic or sclerotic osseous lesions. IMPRESSION: 1. No acute findings in the abdomen or pelvis. No evidence for hydroureteronephrosis. 2. 2 mm nonobstructing stone in the right kidney with probable small left renal cyst. 3. 3.7 cm calcified left adnexal lesion may be an exophytic fibroid or ovarian process. Pelvic ultrasound could be  used to further evaluate as clinically warranted. 4. 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 Electronically Signed   By: Kennith CenterEric  Mansell M.D.   On: 09/05/2016 15:57   Ct Head Wo Contrast  Result Date: 09/05/2016 CLINICAL DATA:  Mental status decline over the last week. EXAM: CT HEAD WITHOUT CONTRAST TECHNIQUE: Contiguous axial images were obtained from the base of the skull through the vertex without intravenous contrast. COMPARISON:  09/01/2016 an multiple previous FINDINGS: Brain: No change since the previous study. Generalized brain atrophy. Old small vessel cerebellar infarctions bilaterally. Advanced chronic small-vessel ischemic change of the cerebral hemispheric white matter with ex vacuo enlargement of lateral ventricles. Normal pressure hydrocephalus is not excluded by imaging. Old left MCA territory infarction with atrophy encephalomalacia. Vascular: There is atherosclerotic calcification of the major vessels at the base of the brain. Skull: Negative Sinuses/Orbits: Clear/normal Other: None significant IMPRESSION: No acute finding by CT. Atrophy an extensive chronic small vessel ischemic changes. 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  Ventricular prominence is presumably secondary to central atrophy. However, this is increasing over time. The possibility of normal pressure hydrocephalus is not excluded. Electronically Signed   By: Paulina Fusi M.D.   On: 09/05/2016 12:43    EKG: Independently reviewed. Sinus rhythm with T-wave inversion in V5 and V6 which are seen on prior EKGs  Assessment/Plan Active Problems:   AKI (acute kidney injury) (HCC) - Baseline serum creatinine near 1.0 - Currently serum creatinine at 2.3 - Most likely prerenal etiology secondary to dehydration and poor oral intake. We'll plan on IV fluid rehydration - CT scan obtained of abdomen and note: No acute findings in the abdomen or pelvis. No evidence for hydroureteronephrosis. 2. 2 mm nonobstructing stone  in the right kidney with probable small left renal cyst.  Adnexal lesion on CT scan: 3.7 cm calcified left adnexal lesion may be an exophytic fibroid or ovarian process. Pelvic ultrasound could be used to further evaluate as clinically warranted - suggest outpatient evaluation if caregiver brother would like to persue aggressive evaluation. Patient in my opinion is not candidate for aggressive therapy however    Dehydration - Patient was given 1 L normal saline bolus in the ED. Is currently on D5 one half normal saline running at 1 50 mL per hour. We'll reassess BMP next a.m. - serum creatinine ration of more than 20> 1. Also skin tenting on evaluation - elevated specific gravity 1.028    Stroke (cerebrum) (HCC) - CT scan of head obtained due to AMS. No acute CT scan findings reported.  - continue aspirin and statin    Essential hypertension - Blood pressures within normal limits off of blood pressure medications.    HLD (hyperlipidemia) - on statin    Dementia - We'll continue Aricept and Namenda as well as Risperdal    DNR (do not resuscitate)  - Component of brother's caretaker  DVT prophylaxis: Heparin Code Status: DNR confirmed with brother. Family Communication: Brother who is care taker. Son's phone number in emergency contact outdated. Disposition Plan:  Consults called: None Admission status: obs   Penny Pia MD [facility] Pager [phone]  If 7PM-7AM, please contact night-coverage [email or web address] Password [facility]  09/05/2016, 6:28 PM

## 2016-09-05 NOTE — Progress Notes (Signed)
Patient arrived to [id number] via stretcher. VSS. Patient is responds to pain but is otherwise non verbal. Blue gown and purewick placed on patient. Mouth care given.

## 2016-09-05 NOTE — ED Notes (Signed)
pts brother calvin called for update on patient. This RN advised no specific information to give him at this time and that patient is stable.

## 2016-09-05 NOTE — Progress Notes (Signed)
Attempted to obtain report from ED RN. This RN will await a return call.

## 2016-09-06 ENCOUNTER — Encounter (HOSPITAL_COMMUNITY): Payer: Self-pay | Admitting: General Practice

## 2016-09-06 DIAGNOSIS — E860 Dehydration: Secondary | ICD-10-CM | POA: Diagnosis present

## 2016-09-06 DIAGNOSIS — Z96652 Presence of left artificial knee joint: Secondary | ICD-10-CM | POA: Diagnosis present

## 2016-09-06 DIAGNOSIS — Z7982 Long term (current) use of aspirin: Secondary | ICD-10-CM | POA: Diagnosis not present

## 2016-09-06 DIAGNOSIS — E870 Hyperosmolality and hypernatremia: Secondary | ICD-10-CM | POA: Diagnosis present

## 2016-09-06 DIAGNOSIS — E785 Hyperlipidemia, unspecified: Secondary | ICD-10-CM | POA: Diagnosis present

## 2016-09-06 DIAGNOSIS — E875 Hyperkalemia: Secondary | ICD-10-CM | POA: Diagnosis present

## 2016-09-06 DIAGNOSIS — G309 Alzheimer's disease, unspecified: Secondary | ICD-10-CM | POA: Diagnosis present

## 2016-09-06 DIAGNOSIS — Z66 Do not resuscitate: Secondary | ICD-10-CM | POA: Diagnosis present

## 2016-09-06 DIAGNOSIS — G9341 Metabolic encephalopathy: Secondary | ICD-10-CM | POA: Diagnosis present

## 2016-09-06 DIAGNOSIS — I10 Essential (primary) hypertension: Secondary | ICD-10-CM | POA: Diagnosis present

## 2016-09-06 DIAGNOSIS — F0280 Dementia in other diseases classified elsewhere without behavioral disturbance: Secondary | ICD-10-CM | POA: Diagnosis present

## 2016-09-06 DIAGNOSIS — Z7189 Other specified counseling: Secondary | ICD-10-CM | POA: Diagnosis not present

## 2016-09-06 DIAGNOSIS — Z515 Encounter for palliative care: Secondary | ICD-10-CM | POA: Diagnosis present

## 2016-09-06 DIAGNOSIS — N179 Acute kidney failure, unspecified: Secondary | ICD-10-CM | POA: Diagnosis present

## 2016-09-06 DIAGNOSIS — Z8673 Personal history of transient ischemic attack (TIA), and cerebral infarction without residual deficits: Secondary | ICD-10-CM | POA: Diagnosis not present

## 2016-09-06 DIAGNOSIS — F0281 Dementia in other diseases classified elsewhere with behavioral disturbance: Secondary | ICD-10-CM | POA: Diagnosis not present

## 2016-09-06 DIAGNOSIS — Z87891 Personal history of nicotine dependence: Secondary | ICD-10-CM | POA: Diagnosis not present

## 2016-09-06 LAB — BASIC METABOLIC PANEL
BUN: 77 mg/dL — ABNORMAL HIGH (ref 6–20)
CO2: 25 mmol/L (ref 22–32)
CREATININE: 1.88 mg/dL — AB (ref 0.44–1.00)
Calcium: 8.7 mg/dL — ABNORMAL LOW (ref 8.9–10.3)
GFR calc non Af Amer: 26 mL/min — ABNORMAL LOW (ref >60)
GFR, EST AFRICAN AMERICAN: 30 mL/min — AB (ref >60)
Glucose, Bld: 240 mg/dL — ABNORMAL HIGH (ref 65–99)
POTASSIUM: 3.6 mmol/L (ref 3.5–5.1)
SODIUM: 173 mmol/L — AB (ref 135–145)

## 2016-09-06 LAB — CBC
HCT: 44.2 % (ref 36.0–46.0)
Hemoglobin: 12.9 g/dL (ref 12.0–15.0)
MCH: 29.1 pg (ref 26.0–34.0)
MCHC: 29.2 g/dL — ABNORMAL LOW (ref 30.0–36.0)
MCV: 99.5 fL (ref 78.0–100.0)
PLATELETS: 134 10*3/uL — AB (ref 150–400)
RBC: 4.44 MIL/uL (ref 3.87–5.11)
RDW: 15.8 % — ABNORMAL HIGH (ref 11.5–15.5)
WBC: 7.5 10*3/uL (ref 4.0–10.5)

## 2016-09-06 LAB — GLUCOSE, CAPILLARY
GLUCOSE-CAPILLARY: 121 mg/dL — AB (ref 65–99)
GLUCOSE-CAPILLARY: 197 mg/dL — AB (ref 65–99)
Glucose-Capillary: 181 mg/dL — ABNORMAL HIGH (ref 65–99)
Glucose-Capillary: 178 mg/dL — ABNORMAL HIGH (ref 65–99)

## 2016-09-06 MED ORDER — SODIUM CHLORIDE 0.45 % IV SOLN
INTRAVENOUS | Status: DC
  Administered 2016-09-06: 13:00:00 via INTRAVENOUS

## 2016-09-06 MED ORDER — VALPROATE SODIUM 500 MG/5ML IV SOLN
250.0000 mg | Freq: Two times a day (BID) | INTRAVENOUS | Status: DC
  Administered 2016-09-06 – 2016-09-08 (×4): 250 mg via INTRAVENOUS
  Filled 2016-09-06 (×5): qty 2.5

## 2016-09-06 MED ORDER — DEXTROSE 5 % IV SOLN
INTRAVENOUS | Status: DC
  Administered 2016-09-06 – 2016-09-07 (×4): via INTRAVENOUS

## 2016-09-06 NOTE — Progress Notes (Signed)
CRITICAL VALUE ALERT  Critical Value:  Na+173,Chloride>130.  Date & Time Notied:  6/27 0445  Provider Notified:Katherine Schorr Orders Received/Actions taken:none

## 2016-09-06 NOTE — Progress Notes (Signed)
PROGRESS NOTE    Glenda Coleman  RUE:[id number] DOB: [birth year] DOA: 09/05/2016 PCP: Elinor Dodge, MD    Brief Narrative:  Patient is a 71 year old with history of hypertension, prior stroke, dementia patient is nonverbal at baseline but a skilled nursing facility where they know her well patient was not her normal self as such was brought in for further evaluation. In the emergency department was found to have acute kidney injury and dehydration. We were consulted for further evaluation recommendations.   Assessment & Plan:   Active Problems:   AKI (acute kidney injury) (HCC) - Serum creatinine trending down with IV fluid rehydration. We'll plan on continuing rehydration but will discontinue D5 in fluids. - Secondary to prerenal etiology. BUN and creatinine ratio still more than 20-1.  - Baseline serum creatinine near 1 and on last check was 1.8   Altered mental status - Secondary to dehydration most likely. Patient is becoming more alert with IV fluid rehydration. - Difficult to assess however as patient is nonverbal at baseline - CT scan of head reported no acute findings    Dehydration   Stroke (cerebrum) (HCC) - Patient is nonverbal at baseline - Continue aspirin and statin as secondary prevention methods    Essential hypertension - We'll controlled off antihypertensives  Hyperglycemia - Will obtain hemoglobin A1c, change diet to diabetic diet and discontinue dextrose infusion. - Will have nursing check blood sugars every 4 hours 3 times    HLD (hyperlipidemia) - stable patient is on statin    Dementia - Complicating hospital stay and patient is nonverbal at baseline - Patient should have palliative care services and talks while at skilled nursing facility after discharge    DNR (do not resuscitate) - Confirmed with brother  Left adnexal lesion - Discussed with brother who is care taker. No aggressive workup planned. In my opinion patient is not  strong enough to undergo chemotherapy or surgical treatment if it were warranted and I have discussed with brother who agrees. Of note is that if he wanted to evaluation this is something they can do as an outpatient. As of now no desire to work up at this moment.    DVT prophylaxis:Heparin Code Status: DNR Family Communication: spoke with brother Disposition Plan: Pending improvement in condition   Consultants:   None   Procedures: None   Antimicrobials: None   Subjective: The patient is nonverbal at baseline. No acute issues reported to me overnight.  Objective: Vitals:   09/05/16 1730 09/05/16 1745 09/05/16 2145 09/06/16 0629  BP: 132/77 120/83 (!) 148/100 117/64  Pulse: 61 (!) 55 69 (!) 54  Resp:   16 14  Temp:   98.9 F (37.2 C) 99 F (37.2 C)  TempSrc:   Oral Axillary  SpO2: 97% 99% (!) 84% 100%    Intake/Output Summary (Last 24 hours) at 09/06/16 1044 Last data filed at 09/06/16 0630  Gross per 24 hour  Intake              290 ml  Output                0 ml  Net              290 ml   There were no vitals filed for this visit.  Examination:  General exam: Appears calm and comfortable , In no acute distress Respiratory system: Clear to auscultation. Respiratory effort normal. Cardiovascular system: S1 & S2 heard, RRR. No JVD, murmurs, rubs, gallops  Gastrointestinal system: Abdomen is nondistended, soft and nontender.  Central nervous system: Patient gazes my general direction but is nonverbal, difficult exam due to limited cooperation secondary to dementia Extremities: Warm with distal pulses Skin: No rashes, lesions or ulcers, On limited exam difficult exam as patient is contracted. Psychiatry: Unable to assess secondary to nonverbal status    Data Reviewed: I have personally reviewed following labs and imaging studies  CBC:  Recent Labs Lab 09/05/16 1310 09/06/16 0327  WBC 8.1 7.5  NEUTROABS 5.3  --   HGB 13.2 12.9  HCT 46.1* 44.2  MCV  98.7 99.5  PLT 151 134*   Basic Metabolic Panel:  Recent Labs Lab 09/05/16 1310 09/05/16 1940 09/06/16 0327  NA 178*  --  173*  K 4.4  --  3.6  CL >130*  --  >130*  CO2 28  --  25  GLUCOSE 94  --  240*  BUN 84*  --  77*  CREATININE 2.39*  --  1.88*  CALCIUM 9.2  --  8.7*  MG  --  3.0*  --   PHOS  --  4.6  --    GFR: Estimated Creatinine Clearance: 27.7 mL/min (A) (by C-G formula based on SCr of 1.88 mg/dL (H)). Liver Function Tests: No results for input(s): AST, ALT, ALKPHOS, BILITOT, PROT, ALBUMIN in the last 168 hours. No results for input(s): LIPASE, AMYLASE in the last 168 hours. No results for input(s): AMMONIA in the last 168 hours. Coagulation Profile: No results for input(s): INR, PROTIME in the last 168 hours. Cardiac Enzymes: No results for input(s): CKTOTAL, CKMB, CKMBINDEX, TROPONINI in the last 168 hours. BNP (last 3 results) No results for input(s): PROBNP in the last 8760 hours. HbA1C: No results for input(s): HGBA1C in the last 72 hours. CBG: No results for input(s): GLUCAP in the last 168 hours. Lipid Profile: No results for input(s): CHOL, HDL, LDLCALC, TRIG, CHOLHDL, LDLDIRECT in the last 72 hours. Thyroid Function Tests: No results for input(s): TSH, T4TOTAL, FREET4, T3FREE, THYROIDAB in the last 72 hours. Anemia Panel: No results for input(s): VITAMINB12, FOLATE, FERRITIN, TIBC, IRON, RETICCTPCT in the last 72 hours. Sepsis Labs: No results for input(s): PROCALCITON, LATICACIDVEN in the last 168 hours.  No results found for this or any previous visit (from the past 240 hour(s)).       Radiology Studies: Ct Abdomen Pelvis Wo Contrast  Result Date: 09/05/2016 CLINICAL DATA:  Acute renal failure. Evaluate for obstructive uropathy. EXAM: CT ABDOMEN AND PELVIS WITHOUT CONTRAST TECHNIQUE: Multidetector CT imaging of the abdomen and pelvis was performed following the standard protocol without IV contrast. COMPARISON:  None. FINDINGS: Study is  limited by patient's scanning with arms over the abdomen. Lower chest: Atelectasis noted right middle and lower lobes. 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 Hepatobiliary: No focal abnormality in the liver on this study without intravenous contrast. There is no evidence for gallstones, gallbladder wall thickening, or pericholecystic fluid. No intrahepatic or extrahepatic biliary dilation. Pancreas: No focal mass lesion. No dilatation of the main duct. No intraparenchymal cyst. No peripancreatic edema. Spleen: No splenomegaly. No focal mass lesion. Adrenals/Urinary Tract: 2 mm nonobstructing stone identified in the right kidney. No right hydronephrosis. No right ureteral stone. No right hydroureter. 13 mm lesion in the interpolar left kidney approaches water attenuation and is likely a cyst. No evidence for left hydroureteronephrosis. No left-sided urinary stone disease. Bladder is decompressed without evidence of stones. Stomach/Bowel: Stomach is nondistended. No gastric  wall thickening. No evidence of outlet obstruction. Duodenum is normally positioned as is the ligament of Treitz. No small bowel wall thickening. No small bowel dilatation. The terminal ileum is normal. The appendix is normal. Diverticular changes are noted in the left colon without evidence of diverticulitis. Vascular/Lymphatic: No abdominal aortic aneurysm. No abdominal aortic atherosclerotic calcification. There is no gastrohepatic or hepatoduodenal ligament lymphadenopathy. No intraperitoneal or retroperitoneal lymphadenopathy. No pelvic sidewall lymphadenopathy. Reproductive: Calcified lesion in the left adnexal space may be an exophytic fibroid or ovarian lesion. Other: No intraperitoneal free fluid. Musculoskeletal: Bone windows reveal no worrisome lytic or sclerotic osseous lesions. IMPRESSION: 1. No acute findings in the abdomen or pelvis. No evidence for hydroureteronephrosis. 2. 2 mm nonobstructing stone in the  right kidney with probable small left renal cyst. 3. 3.7 cm calcified left adnexal lesion may be an exophytic fibroid or ovarian process. Pelvic ultrasound could be used to further evaluate as clinically warranted. 4. 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 Electronically Signed   By: Kennith Center M.D.   On: 09/05/2016 15:57   Ct Head Wo Contrast  Result Date: 09/05/2016 CLINICAL DATA:  Mental status decline over the last week. EXAM: CT HEAD WITHOUT CONTRAST TECHNIQUE: Contiguous axial images were obtained from the base of the skull through the vertex without intravenous contrast. COMPARISON:  09/01/2016 an multiple previous FINDINGS: Brain: No change since the previous study. Generalized brain atrophy. Old small vessel cerebellar infarctions bilaterally. Advanced chronic small-vessel ischemic change of the cerebral hemispheric white matter with ex vacuo enlargement of lateral ventricles. Normal pressure hydrocephalus is not excluded by imaging. Old left MCA territory infarction with atrophy encephalomalacia. Vascular: There is atherosclerotic calcification of the major vessels at the base of the brain. Skull: Negative Sinuses/Orbits: Clear/normal Other: None significant IMPRESSION: No acute finding by CT. Atrophy an extensive chronic small vessel ischemic changes. 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 Ventricular prominence is presumably secondary to central atrophy. However, this is increasing over time. The possibility of normal pressure hydrocephalus is not excluded. Electronically Signed   By: Paulina Fusi M.D.   On: 09/05/2016 12:43    Scheduled Meds: . aspirin EC  81 mg Oral Q breakfast  . atorvastatin  20 mg Oral QHS  . divalproex  250 mg Oral BID  . donepezil  20 mg Oral QHS  . heparin  5,000 Units Subcutaneous Q8H  . memantine  10 mg Oral Q breakfast  . risperiDONE  0.5 mg Oral QHS  . sertraline  100 mg Oral Q breakfast   Continuous Infusions: . sodium chloride        LOS: 0 days    Time spent: > 35 minutes  Penny Pia, MD [facility] Pager [phone]  If 7PM-7AM, please contact night-coverage [email or web address] Password [facility] 09/06/2016, 10:44 AM

## 2016-09-07 DIAGNOSIS — F0281 Dementia in other diseases classified elsewhere with behavioral disturbance: Secondary | ICD-10-CM

## 2016-09-07 DIAGNOSIS — N179 Acute kidney failure, unspecified: Principal | ICD-10-CM

## 2016-09-07 DIAGNOSIS — G309 Alzheimer's disease, unspecified: Secondary | ICD-10-CM

## 2016-09-07 DIAGNOSIS — E870 Hyperosmolality and hypernatremia: Secondary | ICD-10-CM

## 2016-09-07 LAB — BASIC METABOLIC PANEL
Anion gap: 6 (ref 5–15)
BUN: 31 mg/dL — AB (ref 6–20)
CHLORIDE: 120 mmol/L — AB (ref 101–111)
CO2: 26 mmol/L (ref 22–32)
Calcium: 7.9 mg/dL — ABNORMAL LOW (ref 8.9–10.3)
Creatinine, Ser: 1.17 mg/dL — ABNORMAL HIGH (ref 0.44–1.00)
GFR calc Af Amer: 53 mL/min — ABNORMAL LOW (ref >60)
GFR calc non Af Amer: 45 mL/min — ABNORMAL LOW (ref >60)
GLUCOSE: 222 mg/dL — AB (ref 65–99)
POTASSIUM: 3.2 mmol/L — AB (ref 3.5–5.1)
Sodium: 152 mmol/L — ABNORMAL HIGH (ref 135–145)

## 2016-09-07 LAB — GLUCOSE, CAPILLARY: Glucose-Capillary: 127 mg/dL — ABNORMAL HIGH (ref 65–99)

## 2016-09-07 LAB — HEMOGLOBIN A1C
Hgb A1c MFr Bld: 6.4 % — ABNORMAL HIGH (ref 4.8–5.6)
Mean Plasma Glucose: 137 mg/dL

## 2016-09-07 NOTE — Progress Notes (Signed)
[facility].  Patient is total care at baseline and ALF anticipates being able to accept patient back when stable  CSW will continue to follow  Burna SisJenna H. Clancy Leiner, LCSW Clinical Social Worker [phone]

## 2016-09-07 NOTE — Progress Notes (Signed)
Pt is awake at meal times, fed by staff, non-verbal. Pocketing noted at dinner time, Swallow screen done, speech eval ordered.

## 2016-09-07 NOTE — Progress Notes (Signed)
PROGRESS NOTE [facility]   Devoria GlassingMary Champlain   ZOX:[id number] DOB: 1945/06/03  DOA: 09/05/2016 PCP: Elinor DodgeSanchez-Brugal, Fernando A, MD   Brief Narrative:  Patient is a 71 year old with history of hypertension, prior stroke, dementia patient is nonverbal at baseline but a skilled nursing facility where they know her well patient was not her normal self as such was brought in for further evaluation. In the emergency department was found to have acute kidney injury, dehydration and hypernatremia. Patient was started on IVF.   Subjective: Patient seen and examined, patient is non verbal and per brother she is a baseline mentally. No acute events overnight.   Assessment & Plan: AKI  Pre renal from poor oral intake, Cr improving with IVF  UOP has increase  Hold IVF for now and encourage oral hydration. Monitor BMP in AM   Acute metabolic encephalopathy 2/2 hypernatremia, some baseline dementia  Mental status back to baseline per brother.  Continue to monitor   Hypernatremia  Trending down current Na+ 153 Stop D5W  Check BMP in AM   Pre DM  CBG's elevated likely due to aggressive D5W hydration  A1C 6.4  Continue to monitor, no medication needed at this time   Left adnexal lesion - Dr Cena BentonVega discussion with brother who is care taker. No aggressive workup planned. In my opinion patient is not strong enough to undergo chemotherapy or surgical treatment if it were warranted and I have discussed with brother who agrees. Of note is that if he wanted to evaluation this is something they can do as an outpatient. As of now no desire to work up at this moment.  DVT prophylaxis: Heparin  Code Status: DNR  Family Communication: Discussed with brother via phone call  Disposition Plan: SNF in next 24-48 hrs   Consultants:   None   Procedures:   None   Antimicrobials: Anti-infectives    None       Objective: Vitals:   09/06/16 1524 09/06/16 2211 09/07/16 0440 09/07/16 1337  BP:  121/74 (!) 154/88 122/77 (!) 118/56  Pulse: (!) 57 61 (!) 54 60  Resp: 15 16 18 19   Temp:  97.3 F (36.3 C) 97.5 F (36.4 C) 97.4 F (36.3 C)  TempSrc:  Oral Oral Oral  SpO2: 100% 97% 99% 93%    Intake/Output Summary (Last 24 hours) at 09/07/16 1710 Last data filed at 09/07/16 1322  Gross per 24 hour  Intake          2059.17 ml  Output              700 ml  Net          1359.17 ml   There were no vitals filed for this visit.  Examination:  General exam: Non verbal, respond to tactile stimuli  Respiratory system: Clear to auscultation. No wheezes,crackle or rhonchi Cardiovascular system: S1 & S2 heard, RRR. No JVD Gastrointestinal system: Abdomen is nondistended, soft and nontender.  Central nervous system: Non verbal, severe dementia.  Extremities: No pedal edema.   Skin: No rashes, lesions or ulcers Psychiatry: Unable to assess due to non verbal status    Data Reviewed: I have personally reviewed following labs and imaging studies  CBC:  Recent Labs Lab 09/05/16 1310 09/06/16 0327  WBC 8.1 7.5  NEUTROABS 5.3  --   HGB 13.2 12.9  HCT 46.1* 44.2  MCV 98.7 99.5  PLT 151 134*   Basic Metabolic Panel:  Recent Labs Lab 09/05/16 1310 09/05/16 1940 09/06/16  0327 09/07/16 1419  NA 178*  --  173* 152*  K 4.4  --  3.6 3.2*  CL >130*  --  >130* 120*  CO2 28  --  25 26  GLUCOSE 94  --  240* 222*  BUN 84*  --  77* 31*  CREATININE 2.39*  --  1.88* 1.17*  CALCIUM 9.2  --  8.7* 7.9*  MG  --  3.0*  --   --   PHOS  --  4.6  --   --    GFR: Estimated Creatinine Clearance: 44.5 mL/min (A) (by C-G formula based on SCr of 1.17 mg/dL (H)). Liver Function Tests: No results for input(s): AST, ALT, ALKPHOS, BILITOT, PROT, ALBUMIN in the last 168 hours. No results for input(s): LIPASE, AMYLASE in the last 168 hours. No results for input(s): AMMONIA in the last 168 hours. Coagulation Profile: No results for input(s): INR, PROTIME in the last 168 hours. Cardiac  Enzymes: No results for input(s): CKTOTAL, CKMB, CKMBINDEX, TROPONINI in the last 168 hours. BNP (last 3 results) No results for input(s): PROBNP in the last 8760 hours. HbA1C:  Recent Labs  09/06/16 0327  HGBA1C 6.4*   CBG:  Recent Labs Lab 09/06/16 1309 09/06/16 1754 09/06/16 2022 09/06/16 2206  GLUCAP 181* 121* 197* 178*   Lipid Profile: No results for input(s): CHOL, HDL, LDLCALC, TRIG, CHOLHDL, LDLDIRECT in the last 72 hours. Thyroid Function Tests: No results for input(s): TSH, T4TOTAL, FREET4, T3FREE, THYROIDAB in the last 72 hours. Anemia Panel: No results for input(s): VITAMINB12, FOLATE, FERRITIN, TIBC, IRON, RETICCTPCT in the last 72 hours. Sepsis Labs: No results for input(s): PROCALCITON, LATICACIDVEN in the last 168 hours.  No results found for this or any previous visit (from the past 240 hour(s)).     Radiology Studies: No results found.   Scheduled Meds: . aspirin EC  81 mg Oral Q breakfast  . atorvastatin  20 mg Oral QHS  . donepezil  20 mg Oral QHS  . heparin  5,000 Units Subcutaneous Q8H  . memantine  10 mg Oral Q breakfast  . risperiDONE  0.5 mg Oral QHS  . sertraline  100 mg Oral Q breakfast   Continuous Infusions: . valproate sodium Stopped (09/07/16 1147)     LOS: 1 day   Time spent: Total of 15 minutes spent with pt, greater than 50% of which was spent in discussion of  treatment, counseling and coordination of care    Latrelle Dodrill, MD Pager: Text Page via [email or web address]  [phone]  If 7PM-7AM, please contact night-coverage [email or web address] Password [email or web address] 09/07/2016, 5:10 PM

## 2016-09-07 NOTE — Clinical Social Work Note (Signed)
Clinical Social Work Assessment  Patient Details  Name: Glenda Coleman MRN: [id number] Date of Birth: 10/27/1945  Date of referral:  09/07/16               Reason for consult:  Facility Placement                Permission sought to share information with:  Oceanographer[facility]ility Contact Representative Permission granted to share information::  No (pt disoriented)  Name::     PsychiatristCalvin  [facility]  Relationship::  Physicist, medicalbrother/caregiver  Contact Information:     Housing/Transportation Living arrangements for the past 2 months:  [facility] Source of Information:  Other (Comment Required) (sibling) Patient Interpreter Needed:  None Criminal Activity/Legal Involvement Pertinent to Current Situation/Hospitalization:  No - Comment as needed Significant Relationships:  Siblings, Adult Children Lives with:  Facility Resident Do you feel safe going back to the place where you live?  Yes Need for family participation in patient care:  Yes (Comment) (decision making)  Care giving concerns:  None- pt is LTC resident at memory care ALF.   Social Worker assessment / plan:  CSW spoke with pt brother/caregiver and confirmed [facility].   Employment status:  Retired Health and safety inspectornsurance information:  Medicare PT Recommendations:  Not assessed at this time Information / Referral to community resources:     Patient/Family's Response to care:  Brother is agreeable to pt return to ALF when stable.  Patient/Family's Understanding of and Emotional Response to Diagnosis, Current Treatment, and Prognosis:  No questions or concerns- pt brother is hopeful for pt to improve enough to return soon.  Emotional Assessment Appearance:  Appears stated age Attitude/Demeanor/Rapport:  Unable to Assess Affect (typically observed):  Unable to Assess Orientation:    Alcohol / Substance use:  Not Applicable Psych involvement (Current and /or in the community):  No  (Comment)  Discharge Needs  Concerns to be addressed:  Care Coordination Readmission within the last 30 days:  No Current discharge risk:  None Barriers to Discharge:  Continued Medical Work up   Burna SisUris, Jaeger Trueheart H, LCSW 09/07/2016, 3:10 PM

## 2016-09-08 DIAGNOSIS — I10 Essential (primary) hypertension: Secondary | ICD-10-CM

## 2016-09-08 LAB — BASIC METABOLIC PANEL
Anion gap: 7 (ref 5–15)
Anion gap: 9 (ref 5–15)
BUN: 22 mg/dL — ABNORMAL HIGH (ref 6–20)
BUN: 17 mg/dL (ref 6–20)
CALCIUM: 8.1 mg/dL — AB (ref 8.9–10.3)
CALCIUM: 8.5 mg/dL — AB (ref 8.9–10.3)
CHLORIDE: 123 mmol/L — AB (ref 101–111)
CO2: 23 mmol/L (ref 22–32)
CO2: 29 mmol/L (ref 22–32)
CREATININE: 1.07 mg/dL — AB (ref 0.44–1.00)
CREATININE: 1.08 mg/dL — AB (ref 0.44–1.00)
Chloride: 114 mmol/L — ABNORMAL HIGH (ref 101–111)
GFR, EST AFRICAN AMERICAN: 59 mL/min — AB (ref >60)
GFR, EST AFRICAN AMERICAN: 58 mL/min — AB (ref >60)
GFR, EST NON AFRICAN AMERICAN: 51 mL/min — AB (ref >60)
GFR, EST NON AFRICAN AMERICAN: 50 mL/min — AB (ref >60)
Glucose, Bld: 100 mg/dL — ABNORMAL HIGH (ref 65–99)
Glucose, Bld: 108 mg/dL — ABNORMAL HIGH (ref 65–99)
Potassium: 5.6 mmol/L — ABNORMAL HIGH (ref 3.5–5.1)
Potassium: 3.7 mmol/L (ref 3.5–5.1)
SODIUM: 153 mmol/L — AB (ref 135–145)
SODIUM: 152 mmol/L — AB (ref 135–145)

## 2016-09-08 MED ORDER — FUROSEMIDE 10 MG/ML IJ SOLN
40.0000 mg | Freq: Once | INTRAMUSCULAR | Status: AC
  Administered 2016-09-08: 40 mg via INTRAVENOUS
  Filled 2016-09-08: qty 4

## 2016-09-08 MED ORDER — VALPROATE SODIUM 250 MG/5ML PO SOLN
250.0000 mg | Freq: Two times a day (BID) | ORAL | Status: DC
  Administered 2016-09-08 – 2016-09-09 (×3): 250 mg via ORAL
  Filled 2016-09-08 (×5): qty 5

## 2016-09-08 MED ORDER — SODIUM CHLORIDE 0.45 % IV BOLUS
1000.0000 mL | Freq: Once | INTRAVENOUS | Status: AC
  Administered 2016-09-08: 1000 mL via INTRAVENOUS

## 2016-09-08 MED ORDER — SODIUM CHLORIDE 0.45 % IV SOLN
INTRAVENOUS | Status: DC
  Administered 2016-09-08: 11:00:00 via INTRAVENOUS
  Administered 2016-09-09: 1 mL via INTRAVENOUS
  Administered 2016-09-10: 02:00:00 via INTRAVENOUS

## 2016-09-08 NOTE — Progress Notes (Signed)
PROGRESS NOTE [facility]   Glenda Coleman   XLK:[id number] DOB: 08/18/1945  DOA: 09/05/2016 PCP: Glenda Coleman, Glenda A, MD   Brief Narrative:  Patient is Coleman 71 year old with history of hypertension, prior stroke, dementia patient is nonverbal at baseline but [facility] skilled nursing facility where they know her well patient was not her normal self as such was brought in for further evaluation. In the emergency department was found to have acute kidney injury, dehydration and hypernatremia. Patient was started on IVF.   Subjective: Patient seen and examined, more awake today, although non verbal. Brother at bedside, report that this is her baseline. Na+ stable   Assessment & Plan: AKI - improved  Pre renal from poor oral intake, Cr improving with IVF  Continues with good UOP   Encourage oral hydration. Monitor BMP in AM   Acute metabolic encephalopathy 2/2 hypernatremia, some baseline dementia  Mental status back to baseline per brother.  Continue to monitor   Hypernatremia  Na+ stable - If sodium between 145-148 in AM ok to d/c  Will resume 1/2 NS  Monitor BMP   Hyperkalemia ? Hemolysis  1 dose of lasix given and Coleman IVF   Pre DM  CBG's elevated likely due to aggressive D5W - Fluids changed to 1/2 N.  CBG's improved   A1C 6.4  Continue to monitor, no medication needed at this time   Left adnexal lesion - Dr Glenda Coleman discussion with brother who is care taker. No aggressive workup planned. In my opinion patient is not strong enough to undergo chemotherapy or surgical treatment if it were warranted and I have discussed with brother who agrees. Of note is that if he wanted to evaluation this is something they can do as an outpatient. As of now no desire to work up at this moment.  DVT prophylaxis: Heparin  Code Status: DNR  Family Communication: Discussed with brother at bedside Disposition Plan: SNF in next 24-48 hrs   Consultants:   None   Procedures:   None    Antimicrobials: Anti-infectives    None      Objective: Vitals:   09/07/16 0440 09/07/16 1337 09/07/16 2305 09/08/16 0510  BP: 122/77 (!) 118/56 124/78 116/84  Pulse: (!) 54 60 81 87  Resp: 18 19 18 16   Temp: 97.5 F (36.4 C) 97.4 F (36.3 C) 97.9 F (36.6 C) 98.4 F (36.9 C)  TempSrc: Oral Oral Oral Oral  SpO2: 99% 93% 99% 96%    Intake/Output Summary (Last 24 hours) at 09/08/16 1423 Last data filed at 09/08/16 1412  Gross per 24 hour  Intake              240 ml  Output             2500 ml  Net            -2260 ml   There were no vitals filed for this visit.  Examination:  General: More awake, responsive to verbal and tactile stimuli. Non verbal  Cardiovascular: RRR, S1/S2 +, no rubs, no gallops Respiratory: CTA bilaterally, no wheezing, no rhonchi Neuro: Per brother at baseline, non cooperative with exam   Extremities: no edema   Data Reviewed: I have personally reviewed following labs and imaging studies  CBC:  Recent Labs Lab 09/05/16 1310 09/06/16 0327  WBC 8.1 7.5  NEUTROABS 5.3  --   HGB 13.2 12.9  HCT 46.1* 44.2  MCV 98.7 99.5  PLT 151 134*   Basic Metabolic Panel:  Recent Labs Lab 09/05/16 1310 09/05/16 1940 09/06/16 0327 09/07/16 1419 09/08/16 0118  NA 178*  --  173* 152* 153*  K 4.4  --  3.6 3.2* 5.6*  CL >130*  --  >130* 120* 123*  CO2 28  --  25 26 23   GLUCOSE 94  --  240* 222* 100*  BUN 84*  --  77* 31* 22*  CREATININE 2.39*  --  1.88* 1.17* 1.07*  CALCIUM 9.2  --  8.7* 7.9* 8.1*  MG  --  3.0*  --   --   --   PHOS  --  4.6  --   --   --    GFR: Estimated Creatinine Clearance: 48.7 mL/min (Coleman) (by C-G formula based on SCr of 1.07 mg/dL (H)). Liver Function Tests: No results for input(s): AST, ALT, ALKPHOS, BILITOT, PROT, ALBUMIN in the last 168 hours. No results for input(s): LIPASE, AMYLASE in the last 168 hours. No results for input(s): AMMONIA in the last 168 hours. Coagulation Profile: No results for input(s): INR,  PROTIME in the last 168 hours. Cardiac Enzymes: No results for input(s): CKTOTAL, CKMB, CKMBINDEX, TROPONINI in the last 168 hours. BNP (last 3 results) No results for input(s): PROBNP in the last 8760 hours. HbA1C:  Recent Labs  09/06/16 0327  HGBA1C 6.4*   CBG:  Recent Labs Lab 09/06/16 1309 09/06/16 1754 09/06/16 2022 09/06/16 2206 09/07/16 2216  GLUCAP 181* 121* 197* 178* 127*   Lipid Profile: No results for input(s): CHOL, HDL, LDLCALC, TRIG, CHOLHDL, LDLDIRECT in the last 72 hours. Thyroid Function Tests: No results for input(s): TSH, T4TOTAL, FREET4, T3FREE, THYROIDAB in the last 72 hours. Anemia Panel: No results for input(s): VITAMINB12, FOLATE, FERRITIN, TIBC, IRON, RETICCTPCT in the last 72 hours. Sepsis Labs: No results for input(s): PROCALCITON, LATICACIDVEN in the last 168 hours.  No results found for this or any previous visit (from the past 240 hour(s)).    Radiology Studies: No results found.   Scheduled Meds: . aspirin EC  81 mg Oral Q breakfast  . atorvastatin  20 mg Oral QHS  . donepezil  20 mg Oral QHS  . heparin  5,000 Units Subcutaneous Q8H  . memantine  10 mg Oral Q breakfast  . risperiDONE  0.5 mg Oral QHS  . sertraline  100 mg Oral Q breakfast   Continuous Infusions: . sodium chloride 75 mL/hr at 09/08/16 1034  . valproate sodium Stopped (09/08/16 1144)     LOS: 2 days   Time spent: Total of 15 minutes spent with pt, greater than 50% of which was spent in discussion of  treatment, counseling and coordination of care    Latrelle Dodrill, MD Pager: Text Page via [email or web address]  [phone]  If 7PM-7AM, please contact night-coverage [email or web address] Password [facility] 09/08/2016, 2:23 PM

## 2016-09-08 NOTE — Care Management Note (Signed)
Case Management Note  Patient Details  Name: Glenda Coleman MRN: [id number] Date of Birth: 05-04-1945  Subjective/Objective:  [facility] on 09/05/16 with AKI, dehydration, and hypernatremia.                  Action/Plan: Received call from [employer] Lineberry, (phone [phone]) [facility]:  She states staff has come to hospital to evaluate pt for return to ALF, and that she is not at baseline.   Facility is concerned that pt may need short-term rehab prior to returning to ALF, as they may not be able to meet her needs at current level.  Ms. Chase PicketLineberry has spoken with pt's brother, Glenda Coleman, and he is in agreement with this.  Facility does want pt to come back to them after rehab stay.  Spoke with attending; will order PT/OT consults.  CSW consulted to facilitate discharge to SNF upon medical stability.    Expected Discharge Date:                  Expected Discharge Plan:  Skilled Nursing Facility  In-House Referral:  Clinical Social Work  Discharge planning Services     Post Acute Care Choice:    Choice offered to:     DME Arranged:    DME Agency:     HH Arranged:    HH Agency:     Status of Service:  In process, will continue to follow  If discussed at Long Length of Stay Meetings, dates discussed:    Additional Comments:  Glennon Macmerson, Mathis Cashman M, RN 09/08/2016, 4:42 PM

## 2016-09-08 NOTE — Clinical Social Work Note (Signed)
[facility] and patient's brother concerned that patient needs SNF before returning to facility due to decline. CSW paged MD regarding PT/OT orders.  Charlynn CourtSarah Khloee Garza, CSW [phone]

## 2016-09-08 NOTE — Evaluation (Signed)
Clinical/Bedside Swallow Evaluation Patient Details  Name: Glenda Coleman MRN: [id number] Date of Birth: September 14, 1945  Today's Date: 09/08/2016 Time: SLP Start Time (ACUTE ONLY): 1142 SLP Stop Time (ACUTE ONLY): 1157 SLP Time Calculation (min) (ACUTE ONLY): 15 min  Past Medical History:  Past Medical History:  Diagnosis Date  . Alzheimer disease   . Arthritis    "back" (08/27/2015)  . Chronic lower back pain   . Dehydration 08/2016  . Dementia    "severe" (08/27/2015)  . Depression   . Hypernatremia 08/2016  . Hypertension   . Stroke (HCC) 08/27/2015  . TIA (transient ischemic attack) dx'd 2015   "dr said she'd had a few; put her on aspirin at that point"  . Type II diabetes mellitus (HCC)    Past Surgical History:  Past Surgical History:  Procedure Laterality Date  . ABDOMINAL HYSTERECTOMY    . PARTIAL KNEE ARTHROPLASTY Left    HPI:  71 y.o. female with medical history significant of prior stroke (08/2015) hypertension, hyperlipidemia, Alzheimer's disease. Admitted due to altered mental status above baseline per caregivers at SNF. Per chart patient is nonverbal at baseline. Found to have hyperchloremia, elevated specific gravity, hypernatremia, and acute kidney injury. CT No acute finding by CT. Atrophy an extensive chronic small vessel ischemic changes. 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 BSE 10/27 revealed Delayed oral transiting/mastication up to 45 seconds but no residuals apparent. Pt failed swallow screen due to pocketing   Assessment / Plan / Recommendation Clinical Impression  Pt in nonverbal and unable to follow comnands for evaulation of oral cavity. Moans when moved; keeps legs curled up and on her side but able to establish suboptimal but adequate positioning with assistance. Demenita-like behaviors" biting straw and spoon initially, able to propel to posterior oral cavity. No s/s aspiration however is at higher risk given dementia and dependent feeder. Has history of oral  dysphagia and witnessed pocketing therefore solid not attempted. Pt would benefit from puree Dys 1 texture, thin liquids, straws allowed, crushed meds and full supervision/assist. When discharged to SNF SLP can assess for potential texture upgrade if appropriate. No further needs.  SLP Visit Diagnosis: Dysphagia, oropharyngeal phase (R13.12)    Aspiration Risk  Moderate aspiration risk    Diet Recommendation Dysphagia 1 (Puree);Thin liquid   Liquid Administration via: Straw Medication Administration: Crushed with puree Supervision: Staff to assist with self feeding;Full supervision/cueing for compensatory strategies Compensations: Slow rate;Minimize environmental distractions;Small sips/bites Postural Changes: Seated upright at 90 degrees    Other  Recommendations Oral Care Recommendations: Oral care BID   Follow up Recommendations Skilled Nursing facility      Frequency and Duration            Prognosis        Swallow Study   General HPI: 71 y.o. female with medical history significant of prior stroke (08/2015) hypertension, hyperlipidemia, Alzheimer's disease. Admitted due to altered mental status above baseline per caregivers at SNF. Per chart patient is nonverbal at baseline. Found to have hyperchloremia, elevated specific gravity, hypernatremia, and acute kidney injury. CT No acute finding by CT. Atrophy an extensive chronic small vessel ischemic changes. 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 BSE 10/27 revealed Delayed oral transiting/mastication up to 45 seconds but no residuals apparent. Pt failed swallow screen due to pocketing Type of Study: Bedside Swallow Evaluation Previous Swallow Assessment:  (see HPI) Diet Prior to this Study: Regular;Thin liquids Temperature Spikes Noted: No Respiratory Status: Room air History of Recent Intubation: No Behavior/Cognition: Requires cueing;Lethargic/Drowsy (adequately  awake) Oral Cavity Assessment:  (unable to adequately view) Oral  Care Completed by SLP: No Oral Cavity - Dentition:  (unable to view; appears to have dentures) Vision: Functional for self-feeding (uncertain) Self-Feeding Abilities: Total assist Patient Positioning: Upright in bed Baseline Vocal Quality: Normal Volitional Cough: Cognitively unable to elicit Volitional Swallow: Unable to elicit    Oral/Motor/Sensory Function Overall Oral Motor/Sensory Function:  (unable to fully assess)   Ice Chips Ice chips: Not tested   Thin Liquid Thin Liquid: Impaired Presentation: Straw Oral Phase Impairments: Reduced labial seal;Reduced lingual movement/coordination Pharyngeal  Phase Impairments:  (none)    Nectar Thick Nectar Thick Liquid: Not tested   Honey Thick Honey Thick Liquid: Not tested   Puree Puree: Within functional limits   Solid   GO   Solid: Not tested        Royce Macadamia 09/08/2016,12:20 PM  Breck Coons Lonell Face.Ed [employer] [phone]

## 2016-09-09 DIAGNOSIS — Z7189 Other specified counseling: Secondary | ICD-10-CM

## 2016-09-09 LAB — BASIC METABOLIC PANEL
Anion gap: 8 (ref 5–15)
Anion gap: 7 (ref 5–15)
BUN: 13 mg/dL (ref 6–20)
BUN: 14 mg/dL (ref 6–20)
CHLORIDE: 118 mmol/L — AB (ref 101–111)
CHLORIDE: 113 mmol/L — AB (ref 101–111)
CO2: 25 mmol/L (ref 22–32)
CO2: 27 mmol/L (ref 22–32)
CREATININE: 0.96 mg/dL (ref 0.44–1.00)
CREATININE: 0.95 mg/dL (ref 0.44–1.00)
Calcium: 8.1 mg/dL — ABNORMAL LOW (ref 8.9–10.3)
Calcium: 8 mg/dL — ABNORMAL LOW (ref 8.9–10.3)
GFR calc Af Amer: >60 mL/min (ref >60)
GFR calc Af Amer: >60 mL/min (ref >60)
GFR calc non Af Amer: 58 mL/min — ABNORMAL LOW (ref >60)
GFR calc non Af Amer: 58 mL/min — ABNORMAL LOW (ref >60)
Glucose, Bld: 94 mg/dL (ref 65–99)
Glucose, Bld: 150 mg/dL — ABNORMAL HIGH (ref 65–99)
POTASSIUM: 3.7 mmol/L (ref 3.5–5.1)
POTASSIUM: 3.1 mmol/L — AB (ref 3.5–5.1)
Sodium: 151 mmol/L — ABNORMAL HIGH (ref 135–145)
Sodium: 147 mmol/L — ABNORMAL HIGH (ref 135–145)

## 2016-09-09 LAB — GLUCOSE, CAPILLARY
GLUCOSE-CAPILLARY: 96 mg/dL (ref 65–99)
GLUCOSE-CAPILLARY: 150 mg/dL — AB (ref 65–99)
GLUCOSE-CAPILLARY: 104 mg/dL — AB (ref 65–99)
Glucose-Capillary: 90 mg/dL (ref 65–99)

## 2016-09-09 NOTE — Progress Notes (Signed)
PROGRESS NOTE [facility]   Devoria GlassingMary Kealey   ZOX:[id number] DOB: [birth year]  DOA: 09/05/2016 PCP: Elinor DodgeSanchez-Brugal, Fernando A, MD   Brief Narrative:  Patient is a 71 year old with history of hypertension, prior stroke, dementia patient is nonverbal at baseline but a skilled nursing facility where they know her well patient was not her normal self as such was brought in for further evaluation. In the emergency department was found to have acute kidney injury, dehydration and hypernatremia. Patient was started on IVF, despite correction of the sodium patient mental status has not improved and she continues to be barerly reactive.   Subjective: Patient seen and examined, awake, non verbal, she is in a fetal position and not moving at all. ALF came and evaluate patient a reported that this is not her baseline. Patient with no significant improvement. Palliative care has been consulted and family have decided to place patient on full comfort  Assessment & Plan: AKI - improved   Pre renal from poor oral intake, Cr improving with IVF  Continues with good UOP   Encourage oral hydration.   Acute metabolic encephalopathy 2/2 hypernatremia, some baseline dementia  Per caregivers at the ALF patient is not at baseline, she was independent. Now she is only a fetal position and is not participating in any kind of activities. She is 2 max assist. We will not pursue further images as we'll not change prognosis. Patient now full comfort care. This was discussed with brother HCPOA, who is in agreement to not pursue further evaluation. We will focus on comfort care measures Social worker consulted for hospice facility.  Hypernatremia - improved Na+ stabilizing but mental status not improving  Now comfort care   Hyperkalemia Resolved   Pre DM  CBG's elevated likely due to aggressive D5W - Fluids changed to 1/2 N.   A1C 6.4  Comfort care, discontinue CBG monitoring   Left adnexal lesion - Dr  Cena BentonVega discussion with brother who is care taker. No aggressive workup planned. In my opinion patient is not strong enough to undergo chemotherapy or surgical treatment if it were warranted and I have discussed with brother who agrees. Of note is that if he wanted to evaluation this is something they can do as an outpatient. As of now no desire to work up at this moment.  Goals of care Discussed with brother healthcare power of attorney, prognosis. Patient has been declining for the past few months, she prefers to be left alone and on one to be touch, also declining oral intake. Brother understand the patient prognosis is not good with minimal oral intake and not able to participate on physical activities. He wishes to continue DO NOT RESUSCITATE status and no PEG tube for feeding. He has decided to move to work full comfort and hospice services.  DVT prophylaxis: Heparin  Code Status: DNR - Hospice  Family Communication: Discussed with brother at bedside Disposition Plan: Hospice facility when available   Consultants:   Palliative care  Procedures:   None   Antimicrobials: Anti-infectives    None      Objective: Vitals:   09/08/16 1423 09/08/16 2100 09/09/16 0528 09/09/16 1354  BP: 118/75 116/77 (!) 147/93 135/78  Pulse: 80 82 75 64  Resp: 18 18 18 14   Temp: 98 F (36.7 C) 98.5 F (36.9 C) 98.6 F (37 C) 97.9 F (36.6 C)  TempSrc: Oral Axillary Axillary Axillary  SpO2: 99% 97% 99% 98%    Intake/Output Summary (Last 24 hours) at 09/09/16  1521 Last data filed at 09/09/16 1355  Gross per 24 hour  Intake           1190.5 ml  Output              750 ml  Net            440.5 ml   There were no vitals filed for this visit.  Examination:  General: Awake nonverbal lacing fetal position and contracted Cardiovascular: RRR, S1/S2 +, no rubs, no gallops Respiratory: CTA bilaterally, no wheezing, no rhonchi Abdominal: Soft, NT, ND, bowel sounds + Extremities: no edema, no  cyanosis  Data Reviewed: I have personally reviewed following labs and imaging studies  CBC:  Recent Labs Lab 09/05/16 1310 09/06/16 0327  WBC 8.1 7.5  NEUTROABS 5.3  --   HGB 13.2 12.9  HCT 46.1* 44.2  MCV 98.7 99.5  PLT 151 134*   Basic Metabolic Panel:  Recent Labs Lab 09/05/16 1940 09/06/16 0327 09/07/16 1419 09/08/16 0118 09/08/16 1434 09/09/16 0237  NA  --  173* 152* 153* 152* 151*  K  --  3.6 3.2* 5.6* 3.7 3.7  CL  --  >130* 120* 123* 114* 118*  CO2  --  25 26 23 29 25   GLUCOSE  --  240* 222* 100* 108* 94  BUN  --  77* 31* 22* 17 13  CREATININE  --  1.88* 1.17* 1.07* 1.08* 0.96  CALCIUM  --  8.7* 7.9* 8.1* 8.5* 8.1*  MG 3.0*  --   --   --   --   --   PHOS 4.6  --   --   --   --   --    GFR: Estimated Creatinine Clearance: 54.3 mL/min (by C-G formula based on SCr of 0.96 mg/dL). Liver Function Tests: No results for input(s): AST, ALT, ALKPHOS, BILITOT, PROT, ALBUMIN in the last 168 hours. No results for input(s): LIPASE, AMYLASE in the last 168 hours. No results for input(s): AMMONIA in the last 168 hours. Coagulation Profile: No results for input(s): INR, PROTIME in the last 168 hours. Cardiac Enzymes: No results for input(s): CKTOTAL, CKMB, CKMBINDEX, TROPONINI in the last 168 hours. BNP (last 3 results) No results for input(s): PROBNP in the last 8760 hours. HbA1C: No results for input(s): HGBA1C in the last 72 hours. CBG:  Recent Labs Lab 09/06/16 2022 09/06/16 2206 09/07/16 2216 09/09/16 0835 09/09/16 1201  GLUCAP 197* 178* 127* 96 150*   Lipid Profile: No results for input(s): CHOL, HDL, LDLCALC, TRIG, CHOLHDL, LDLDIRECT in the last 72 hours. Thyroid Function Tests: No results for input(s): TSH, T4TOTAL, FREET4, T3FREE, THYROIDAB in the last 72 hours. Anemia Panel: No results for input(s): VITAMINB12, FOLATE, FERRITIN, TIBC, IRON, RETICCTPCT in the last 72 hours. Sepsis Labs: No results for input(s): PROCALCITON, LATICACIDVEN in the  last 168 hours.  No results found for this or any previous visit (from the past 240 hour(s)).    Radiology Studies: No results found.   Scheduled Meds: . aspirin EC  81 mg Oral Q breakfast  . atorvastatin  20 mg Oral QHS  . donepezil  20 mg Oral QHS  . heparin  5,000 Units Subcutaneous Q8H  . memantine  10 mg Oral Q breakfast  . risperiDONE  0.5 mg Oral QHS  . sertraline  100 mg Oral Q breakfast  . Valproate Sodium  250 mg Oral BID   Continuous Infusions: . sodium chloride 1 mL (09/09/16 1249)  LOS: 3 days   Time spent: Total of 35 minutes spent with pt, greater than 50% of which was spent in discussion of  treatment, counseling and coordination of care   Latrelle Dodrill, MD Pager: Text Page via [email or web address]  [phone]  If 7PM-7AM, please contact night-coverage [email or web address] Password [email or web address] 09/09/2016, 3:21 PM

## 2016-09-09 NOTE — Progress Notes (Signed)
OT Cancellation Note  Patient Details Name: Glenda GlassingMary Coleman MRN: [id number] DOB: Apr 24, 1945   Cancelled Treatment:    Reason Eval/Treat Not Completed: Other (comment). Spoke with palliative RN; looks like pt moving toward hospice/comfort care. Will sign off from OT standpoint at this time. Please re-consult if needs change. Thank you for this referral.  Gaye AlkenBailey A Alpheus Stiff M.S., OTR/L Pager: [phone]  09/09/2016, 2:51 PM

## 2016-09-09 NOTE — Consult Note (Signed)
Consultation Note Date: 09/09/2016   Patient Name: Glenda Coleman  DOB: 05/29/1945  MRN: [id number]  Age / Sex: 71 y.o., female  PCP: Rocky Morel, MD Referring Physician: Patrecia Pour, Christean Grief, MD  Reason for Consultation: Establishing goals of care  HPI/Patient Profile: 72 y.o. female  with past medical history of hypertension, prior stroke, dementia, in ALF, non verbal at baseline admitted on 09/05/2016 with AKI, hypernatremia.   Clinical Assessment and Goals of Care:  71 year old lady, resident of [facility] assisted living/memory care unit for past few years, has dementia, is non verbal at baseline, her brother Kerry Dory is her HCPOA agent. She had a stroke last year, she has had gradual progressive decline since then. She is now admitted to the hospitalist service with acute kidney injury and hypernatremia, acute metabolic encephalopathy, in addition, abdominal imaging shows possible L adnexal lesion.   A palliative consult has been placed for goals of care discussions.   [name] is resting in bed, she has her eyes open, nurse tech is by the bedside, patient has been seen by SLP, she is to be on dysphagia level 1 diet, pureed food with thin liquids, she had a few sips/bites earlier today. She does not want to be touched, but appears comfortable.   I called and then met with brother HCPOA agent Felicita Gage. I introduced myself and palliative care as follows:  Palliative medicine is specialized medical care for people living with serious illness. It focuses on providing relief from the symptoms and stress of a serious illness. The goal is to improve quality of life for both the patient and the family.  Brief life review performed, patient worked in a [employer] affairs [employer], she is from [place], but has been in [place] for several decades now, has one son who lives in [place], [place].   Patient has  had gradual decline for past few months, she prefers to be left alone most of the times, does not want to be touched. Has had declining PO intake.   We talked about goals and wishes, DNR DNI, no PEG placement discussed and re confirmed. We talked about hospice level of care, hospice philosophy of care. Brother is in agreement to proceed with hospice consult with consideration for residential hospice placement.   See recommendations below, thank you for the consult.   HCPOA  brother calvin worsley [phone]   SUMMARY OF RECOMMENDATIONS    DNR DNI Full comfort measures CSW consult for residential hospice Minimal PO intake, NO PEG Prognosis less than 2 weeks possibly, discussed frankly and compassionately with brother HCPOA agent   Code Status/Advance Care Planning:  DNR    Symptom Management:    as above   Palliative Prophylaxis:   Delirium Protocol  Additional Recommendations (Limitations, Scope, Preferences):  Full Comfort Care  Psycho-social/Spiritual:   Desire for further Chaplaincy support:no  Additional Recommendations: Education on Hospice  Prognosis:   < 2 weeks  Discharge Planning: Hospice facility      Primary Diagnoses: Present  on Admission: . Dehydration . Essential hypertension . HLD (hyperlipidemia) . Dementia . DNR (do not resuscitate) . AKI (acute kidney injury) ([place]) . Stroke (cerebrum) ([place])   I have reviewed the medical record, interviewed the patient and family, and examined the patient. The following aspects are pertinent.  Past Medical History:  Diagnosis Date  . Alzheimer disease   . Arthritis    "back" (08/27/2015)  . Chronic lower back pain   . Dehydration 08/2016  . Dementia    "severe" (08/27/2015)  . Depression   . Hypernatremia 08/2016  . Hypertension   . Stroke ([place]) 08/27/2015  . TIA (transient ischemic attack) dx'd 2015   "dr said she'd had a few; put her on aspirin at that point"  . Type II diabetes mellitus  ([place])    Social History   Social History  . Marital status: Divorced    Spouse name: N/A  . Number of children: N/A  . Years of education: N/A   Social History Main Topics  . Smoking status: Former Smoker    Years: 25.00    Types: Cigarettes  . Smokeless tobacco: Former Systems developer    Types: Chew     Comment: "stoped smoking cigarettes & chewng in the 1980s"  . Alcohol use No     Comment: 08/27/2015 "quit in the 1970s"  . Drug use: No  . Sexual activity: No   Other Topics Concern  . None   Social History Narrative  . None   History reviewed. No pertinent family history. Scheduled Meds: . aspirin EC  81 mg Oral Q breakfast  . atorvastatin  20 mg Oral QHS  . donepezil  20 mg Oral QHS  . heparin  5,000 Units Subcutaneous Q8H  . memantine  10 mg Oral Q breakfast  . risperiDONE  0.5 mg Oral QHS  . sertraline  100 mg Oral Q breakfast  . Valproate Sodium  250 mg Oral BID   Continuous Infusions: . sodium chloride 1 mL (09/09/16 1249)   PRN Meds:. Medications Prior to Admission:  Prior to Admission medications   Medication Sig Start Date End Date Taking? Authorizing Provider  acetaminophen (TYLENOL) 500 MG tablet Take 500 mg by mouth every 4 (four) hours as needed for mild pain, moderate pain, fever or headache.    Yes [provider]  aspirin EC 81 MG EC tablet Take 1 tablet (81 mg total) by mouth daily. Patient taking differently: Take 81 mg by mouth daily with breakfast.  09/02/15  Yes Rice, Resa Miner, MD  atorvastatin (LIPITOR) 20 MG tablet Take 20 mg by mouth at bedtime.   Yes [provider]  Biotin 5 MG TABS Take 5 mg by mouth daily with breakfast.    Yes [provider]  calcium-vitamin D (OSCAL WITH D) 500-200 MG-UNIT tablet Take 1 tablet by mouth 2 (two) times daily.   Yes [provider]  divalproex (DEPAKOTE SPRINKLE) 125 MG capsule Take 250 mg by mouth 2 (two) times daily.   Yes [provider]  donepezil (ARICEPT) 10 MG  tablet Take 20 mg by mouth at bedtime.  09/24/14  Yes [provider]  guaifenesin (ROBAFEN) 100 MG/5ML syrup Take 200 mg by mouth every 6 (six) hours as needed for cough.   Yes [provider]  lisinopril (PRINIVIL,ZESTRIL) 5 MG tablet Take 5 mg by mouth daily with breakfast.  09/08/14  Yes [provider]  memantine (NAMENDA) 10 MG tablet Take 10 mg by mouth daily with  breakfast.  09/30/14  Yes [provider]  Multiple Vitamin (MULTIVITAMIN WITH MINERALS) TABS tablet Take 1 tablet by mouth every morning.   Yes [provider]  neomycin-bacitracin-polymyxin (NEOSPORIN) ointment Apply 1 application topically as needed for wound care.   Yes [provider]  PRESCRIPTION MEDICATION Apply 0.5 mLs topically every 8 (eight) hours as needed (for agitation). Apply to wrist every 8 hours as needed for agitation --- R- Lorazepam PLO Gel 60m/ml   Yes [provider]  risperiDONE (RISPERDAL) 1 MG tablet Take 1 tablet (1 mg total) by mouth as directed. Take 0.538mqAM and 5m2mHS Patient taking differently: Take 0.5 mg by mouth at bedtime.  09/02/15  Yes Rice, ChrResa MinerD  sertraline (ZOLOFT) 100 MG tablet Take 100 mg by mouth daily with breakfast.    Yes [provider]   No Known Allergies Review of Systems Non verbal.   Physical Exam Awake non verbal, which is her baseline Contractures S1 S2 Regular  No abdomen distension No edema  Vital Signs: BP 135/78 (BP Location: Left Arm)   Pulse 64   Temp 97.9 F (36.6 C) (Axillary)   Resp 14   SpO2 98%  Pain Assessment: No/denies pain   Pain Score: 0-No pain   SpO2: SpO2: 98 % O2 Device:SpO2: 98 % O2 Flow Rate: .   IO: Intake/output summary:  Intake/Output Summary (Last 24 hours) at 09/09/16 1516 Last data filed at 09/09/16 1355  Gross per 24 hour  Intake           1190.5 ml  Output              750 ml  Net            440.5 ml    LBM: Last BM Date:  09/08/16 Baseline Weight:   Most recent weight:       Palliative Assessment/Data:   Flowsheet Rows     Most Recent Value  Intake Tab  Referral Department  Hospitalist  Unit at Time of Referral  Med/Surg Unit  Palliative Care Primary Diagnosis  Other (Comment) [dementia, non verbal, dysphagia, ovarian mass ]  Palliative Care Type  New Palliative care  Reason for referral  Clarify Goals of Care, Counsel Regarding Hospice  Date first seen by Palliative Care  09/09/16  Clinical Assessment  Palliative Performance Scale Score  10%  Pain Max last 24 hours  4  Pain Min Last 24 hours  3  Dyspnea Max Last 24 Hours  4  Dyspnea Min Last 24 hours  3  Nausea Max Last 24 Hours  3  Psychosocial & Spiritual Assessment  Palliative Care Outcomes  Patient/Family meeting held?  Yes  Who was at the meeting?  brother HCPOA agent   Palliative Care Outcomes  Clarified goals of care      Time In:  1400 Time Out:  1510 Time Total:  70 min  Greater than 50%  of this time was spent counseling and coordinating care related to the above assessment and plan.  Signed by: ZebLoistine ChanceD [phone] contact Palliative Medicine Team phone at [phone] questions and concerns.  For individual provider: See AmiShea Evans

## 2016-09-09 NOTE — Evaluation (Signed)
Physical Therapy Evaluation Patient Details Name: Glenda GlassingMary Navedo MRN: [id number] DOB: 11/20/1945 Today's Date: 09/09/2016   History of Present Illness  Pt is a 71 yo female admitted through the ED on 09/05/16 with lethargy and was diagnosed with AKI and dehydration. 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   Clinical Impression  Pt presents with the above diagnosis and below deficits for therapy evaluation. Pt is Max A for all mobility since her stroke s/p 18 months and has become contracted in a left side lying position with knees in flexion. Pt moans in pain with any mobility, does not respond to any commands or initiate any movement into or out of bed. Due to cognitive status, pt is a very poor candidate for rehabilitation. Brother was consulted this session and is requesting a Palliative consult as he agrees pt is not appropriate for rehab services. Pt was living in an ALF and they will not allow her to return at current level of function. MD was consulted an in agreement to get palliative involved. Pt is not appropriate for continued PT follow-up and pt will sign off at this time. Please re-order if any future needs arise.     Follow Up Recommendations Supervision/Assistance - 24 hour;Other (comment) (Palliative, per family choice)    Equipment Recommendations  None recommended by PT    Recommendations for Other Services Other (comment) (Palliative)     Precautions / Restrictions Precautions Precautions: Fall Restrictions Weight Bearing Restrictions: No      Mobility  Bed Mobility Overal bed mobility: Needs Assistance Bed Mobility: Sidelying to Sit;Sit to Sidelying   Sidelying to sit: Max assist     Sit to sidelying: Max assist General bed mobility comments: Max A to sit up EOB with max A to maintain sitting. No initation of movement  Transfers                 General transfer comment: Did not attempt due to lack of ability to  sit upright at EOB  Ambulation/Gait             General Gait Details: unable to ambulate  Stairs            Wheelchair Mobility    Modified Rankin (Stroke Patients Only)       Balance Overall balance assessment: History of Falls;Needs assistance Sitting-balance support: Bilateral upper extremity supported;Feet supported Sitting balance-Leahy Scale: Zero Sitting balance - Comments: leaning to left and requires constant assistance to maintain sitting Postural control: Left lateral lean                                   Pertinent Vitals/Pain Pain Assessment: Faces Faces Pain Scale: Hurts whole lot Pain Location: unknown Pain Descriptors / Indicators: Moaning Pain Intervention(s): Monitored during session;Repositioned    Home Living Family/patient expects to be discharged to:: Hospice/Palliative care               Home Equipment: Dan HumphreysWalker - 2 wheels;Wheelchair - manual Additional Comments: Pt would like to have patient placed in palliative care due to current mobility    Prior Function Level of Independence: Needs assistance   Gait / Transfers Assistance Needed: unable to ambulate, max to total A for all transfers and mobility  ADL's / Homemaking Assistance Needed: total A and requries assistance for feeding        Hand Dominance  Dominant Hand: Right    Extremity/Trunk Assessment   Upper Extremity Assessment Upper Extremity Assessment: Difficult to assess due to impaired cognition    Lower Extremity Assessment Lower Extremity Assessment: Difficult to assess due to impaired cognition       Communication   Communication: Expressive difficulties;Receptive difficulties  Cognition Arousal/Alertness: Lethargic Behavior During Therapy: Flat affect Overall Cognitive Status: History of cognitive impairments - at baseline                                 General Comments: pt has been non-verbal and moans with any movemnt  since stroke last year.       General Comments General comments (skin integrity, edema, etc.): brother who is guardian is present throughout session. Wants pt to be considered for palliative care as the ALF will not take her back and she is not appropriate for rehabilitation at a SNF with current level of function. MD consulted and in agreement.     Exercises     Assessment/Plan    PT Assessment Patent does not need any further PT services  PT Problem List         PT Treatment Interventions      PT Goals (Current goals can be found in the Care Plan section)  Acute Rehab PT Goals Patient Stated Goal: none stated PT Goal Formulation: All assessment and education complete, DC therapy    Frequency     Barriers to discharge        Co-evaluation               AM-PAC PT "6 Clicks" Daily Activity  Outcome Measure Difficulty turning over in bed (including adjusting bedclothes, sheets and blankets)?: Total Difficulty moving from lying on back to sitting on the side of the bed? : Total Difficulty sitting down on and standing up from a chair with arms (e.g., wheelchair, bedside commode, etc,.)?: Total Help needed moving to and from a bed to chair (including a wheelchair)?: Total Help needed walking in hospital room?: Total Help needed climbing 3-5 steps with a railing? : Total 6 Click Score: 6    End of Session   Activity Tolerance: Patient limited by lethargy Patient left: in bed;with call bell/phone within reach;with family/visitor present Nurse Communication: Mobility status PT Visit Diagnosis: Muscle weakness (generalized) (M62.81)    Time: 4098-1191 PT Time Calculation (min) (ACUTE ONLY): 30 min   Charges:   PT Evaluation $PT Eval Moderate Complexity: 1 Procedure PT Treatments $Therapeutic Activity: 8-22 mins   PT G Codes:        Colin Broach PT, DPT  [phone]   Ruel Favors Aletha Halim 09/09/2016, 2:24 PM

## 2016-09-10 LAB — BASIC METABOLIC PANEL
Anion gap: 7 (ref 5–15)
BUN: 10 mg/dL (ref 6–20)
CHLORIDE: 111 mmol/L (ref 101–111)
CO2: 28 mmol/L (ref 22–32)
CREATININE: 0.9 mg/dL (ref 0.44–1.00)
Calcium: 8.2 mg/dL — ABNORMAL LOW (ref 8.9–10.3)
GFR calc Af Amer: >60 mL/min (ref >60)
GFR calc non Af Amer: >60 mL/min (ref >60)
Glucose, Bld: 112 mg/dL — ABNORMAL HIGH (ref 65–99)
Potassium: 2.9 mmol/L — ABNORMAL LOW (ref 3.5–5.1)
Sodium: 146 mmol/L — ABNORMAL HIGH (ref 135–145)

## 2016-09-10 NOTE — Progress Notes (Signed)
[facility] by [facility] at th is time

## 2016-09-10 NOTE — Clinical Social Work Note (Addendum)
Pt is ready for d/c today and will transport [facility]. Clinical information faxed to facility and family agreeable with plan.  CSW arranged ambulance transport via PTAR-spoke to badeg #[id number] at 2:04pm.  RN to call [phone] report prior to discharge. CSW called pt brother-Calvin Worsley [phone]) to inform transport arranged. Signed DNR on chart. CSW signing off as no further Social Work needs identified.   Glenda Coleman, LCSWA, LCASA Clinical Social Work [phone]

## 2016-09-10 NOTE — Progress Notes (Signed)
[facility] as facilitated by CSW

## 2016-09-10 NOTE — Discharge Summary (Signed)
Physician Discharge Summary  Glenda Coleman  ZOX:[id number]  DOB: 11/09/1945  DOA: 09/05/2016 PCP: Elinor Dodge, MD  Admit date: 09/05/2016 Discharge date: 09/10/2016  Admitted From: ALF  Disposition:  Hospice   Recommendations for Outpatient Follow-up:  1. Comfort care only    Discharge Condition: Comfort care/Hospice   CODE STATUS: DNR/DNI  Diet recommendation: Dysphagia   Brief/Interim Summary: Patient is a 71 year old with history of hypertension, prior stroke, dementia patient is nonverbal at baseline but a skilled nursing facility where they know her well patient was not her normal self as such was brought in for further evaluation. In the emergency department was found to have acute kidney injury,dehydration and hypernatremia. Patient was started on IVF, despite correction of the sodium and almost complete resolution of renal failure, patient mental status did not improved to baseline and she continues to be barerly reactive. CT of the head did not revealed any acute abnormalities. Palliative care team was consulted, as patient has had a steady decline for the past few months. Brother [facility]) decide to made patient comfort care, and precede with hospice care. Patient will be discharge to hospice care facility.   Subjective: Patient seen and examined on the day of discharge. Patient is awake but minimal reactions to verbal or tactile stimuli. No acute events overnight, remains afebrile. Minimal oral intake.   Discharge Diagnoses/Hospital Course:  Active Problems:   Stroke (cerebrum) (HCC)   Essential hypertension   HLD (hyperlipidemia)   Dementia   DNR (do not resuscitate)   AKI (acute kidney injury) (HCC)   Dehydration  AKI - improved   Pre renal from poor oral intake, Cr improving with IVF  Focus now is comfort measures   Acute metabolic encephalopathy 2/2 hypernatremia, some baseline dementia  Per caregivers at the ALF patient is not at baseline, she was  independent. Now she is only a fetal position and is not participating in any kind of activities. She is 2 max assist. We will not pursue further images as we'll not change prognosis. Patient now full comfort care. This was discussed with brother HCPOA, who is in agreement to not pursue further evaluation. We will focus on comfort care measures  Hypernatremia - improved Na+ stabilizing but mental status not improving  Now comfort care   Hyperkalemia Resolved   Pre DM  CBG's elevated likely due to aggressive D5W - Fluids changed to 1/2 N.   A1C 6.4  Comfort care, discontinue CBG monitoring   Left adnexal lesion - Dr Cena Benton discussion with brother who is HCPOA. No aggressive workup planned.  - Patient now comfort care   Discharge Instructions  You were cared for by a hospitalist during your hospital stay. If you have any questions about your discharge medications or the care you received while you were in the hospital after you are discharged, you can call the unit and asked to speak with the hospitalist on call if the hospitalist that took care of you is not available. Once you are discharged, your primary care physician will handle any further medical issues. Please note that NO REFILLS for any discharge medications will be authorized once you are discharged, as it is imperative that you return to your primary care physician (or establish a relationship with a primary care physician if you do not have one) for your aftercare needs so that they can reassess your need for medications and monitor your lab values.  Discharge Instructions    Call MD for:  difficulty breathing, headache  or visual disturbances    Complete by:  As directed    Call MD for:  extreme fatigue    Complete by:  As directed    Call MD for:  hives    Complete by:  As directed    Call MD for:  persistant dizziness or light-headedness    Complete by:  As directed    Call MD for:  persistant nausea and vomiting     Complete by:  As directed    Call MD for:  redness, tenderness, or signs of infection (pain, swelling, redness, odor or green/yellow discharge around incision site)    Complete by:  As directed    Call MD for:  severe uncontrolled pain    Complete by:  As directed    Call MD for:  temperature >100.4    Complete by:  As directed    Diet general    Complete by:  As directed      Allergies as of 09/10/2016   No Known Allergies     Medication List    STOP taking these medications   acetaminophen 500 MG tablet Commonly known as:  TYLENOL   aspirin 81 MG EC tablet   atorvastatin 20 MG tablet Commonly known as:  LIPITOR   Biotin 5 MG Tabs   calcium-vitamin D 500-200 MG-UNIT tablet Commonly known as:  OSCAL WITH D   donepezil 10 MG tablet Commonly known as:  ARICEPT   lisinopril 5 MG tablet Commonly known as:  PRINIVIL,ZESTRIL   memantine 10 MG tablet Commonly known as:  NAMENDA   multivitamin with minerals Tabs tablet   neomycin-bacitracin-polymyxin ointment Commonly known as:  NEOSPORIN   PRESCRIPTION MEDICATION   ROBAFEN 100 MG/5ML syrup Generic drug:  guaifenesin     TAKE these medications   divalproex 125 MG capsule Commonly known as:  DEPAKOTE SPRINKLE Take 250 mg by mouth 2 (two) times daily.   risperiDONE 1 MG tablet Commonly known as:  RISPERDAL Take 1 tablet (1 mg total) by mouth as directed. Take 0.5mg  qAM and 1mg  qHS What changed:  how much to take  when to take this  additional instructions   sertraline 100 MG tablet Commonly known as:  ZOLOFT Take 100 mg by mouth daily with breakfast.       No Known Allergies  Consultations:  Palliative care    Procedures/Studies: Ct Abdomen Pelvis Wo Contrast  Result Date: 09/05/2016 CLINICAL DATA:  Acute renal failure. Evaluate for obstructive uropathy. EXAM: CT ABDOMEN AND PELVIS WITHOUT CONTRAST TECHNIQUE: Multidetector CT imaging of the abdomen and pelvis was performed following the  standard protocol without IV contrast. COMPARISON:  None. FINDINGS: Study is limited by patient's scanning with arms over the abdomen. Lower chest: Atelectasis noted right middle and lower lobes. 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 Hepatobiliary: No focal abnormality in the liver on this study without intravenous contrast. There is no evidence for gallstones, gallbladder wall thickening, or pericholecystic fluid. No intrahepatic or extrahepatic biliary dilation. Pancreas: No focal mass lesion. No dilatation of the main duct. No intraparenchymal cyst. No peripancreatic edema. Spleen: No splenomegaly. No focal mass lesion. Adrenals/Urinary Tract: 2 mm nonobstructing stone identified in the right kidney. No right hydronephrosis. No right ureteral stone. No right hydroureter. 13 mm lesion in the interpolar left kidney approaches water attenuation and is likely a cyst. No evidence for left hydroureteronephrosis. No left-sided urinary stone disease. Bladder is decompressed without evidence of stones. Stomach/Bowel: Stomach is nondistended.  No gastric wall thickening. No evidence of outlet obstruction. Duodenum is normally positioned as is the ligament of Treitz. No small bowel wall thickening. No small bowel dilatation. The terminal ileum is normal. The appendix is normal. Diverticular changes are noted in the left colon without evidence of diverticulitis. Vascular/Lymphatic: No abdominal aortic aneurysm. No abdominal aortic atherosclerotic calcification. There is no gastrohepatic or hepatoduodenal ligament lymphadenopathy. No intraperitoneal or retroperitoneal lymphadenopathy. No pelvic sidewall lymphadenopathy. Reproductive: Calcified lesion in the left adnexal space may be an exophytic fibroid or ovarian lesion. Other: No intraperitoneal free fluid. Musculoskeletal: Bone windows reveal no worrisome lytic or sclerotic osseous lesions. IMPRESSION: 1. No acute findings in the abdomen or  pelvis. No evidence for hydroureteronephrosis. 2. 2 mm nonobstructing stone in the right kidney with probable small left renal cyst. 3. 3.7 cm calcified left adnexal lesion may be an exophytic fibroid or ovarian process. Pelvic ultrasound could be used to further evaluate as clinically warranted. 4. 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 Electronically Signed   By: Kennith Center M.D.   On: 09/05/2016 15:57   Ct Head Wo Contrast  Result Date: 09/05/2016 CLINICAL DATA:  Mental status decline over the last week. EXAM: CT HEAD WITHOUT CONTRAST TECHNIQUE: Contiguous axial images were obtained from the base of the skull through the vertex without intravenous contrast. COMPARISON:  09/01/2016 an multiple previous FINDINGS: Brain: No change since the previous study. Generalized brain atrophy. Old small vessel cerebellar infarctions bilaterally. Advanced chronic small-vessel ischemic change of the cerebral hemispheric white matter with ex vacuo enlargement of lateral ventricles. Normal pressure hydrocephalus is not excluded by imaging. Old left MCA territory infarction with atrophy encephalomalacia. Vascular: There is atherosclerotic calcification of the major vessels at the base of the brain. Skull: Negative Sinuses/Orbits: Clear/normal Other: None significant IMPRESSION: No acute finding by CT. Atrophy an extensive chronic small vessel ischemic changes. 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 Ventricular prominence is presumably secondary to central atrophy. However, this is increasing over time. The possibility of normal pressure hydrocephalus is not excluded. Electronically Signed   By: Paulina Fusi M.D.   On: 09/05/2016 12:43   Ct Head Wo Contrast  Result Date: 09/01/2016 CLINICAL DATA:  71 year old female with fall. EXAM: CT HEAD WITHOUT CONTRAST CT CERVICAL SPINE WITHOUT CONTRAST TECHNIQUE: Multidetector CT imaging of the head and cervical spine was performed following the standard protocol  without intravenous contrast. Multiplanar CT image reconstructions of the cervical spine were also generated. COMPARISON:  Head CT dated 04/21/2016 FINDINGS: CT HEAD FINDINGS Brain: There is advanced age-related atrophy and chronic microvascular ischemic changes. There is dilatation of the ventricles out of proportion with the sulci which may represent central volume loss versus normal pressure hydrocephalus. Clinical correlation is recommended. There is a large area of old infarct and encephalomalacia in the left parietal lobe. Small cerebellar infarcts and encephalomalacia noted. There is no acute intracranial hemorrhage. No mass effect or midline shift noted. No extra-axial fluid collection. Vascular: No hyperdense vessel or unexpected calcification. Skull: Normal. Negative for fracture or focal lesion. Sinuses/Orbits: There is mild diffuse mucoperiosteal thickening of paranasal sinuses with near complete opacification of the right maxillary sinus. No air-fluid levels. The remainder of the visualized paranasal sinuses and mastoid air cells are clear. Other: None CT CERVICAL SPINE FINDINGS Alignment: No acute subluxation. There is straightening of normal cervical lordosis which may be positional or due to muscle spasm. Skull base and vertebrae: No acute fracture. Soft tissues and spinal canal: No prevertebral fluid or swelling.  No visible canal hematoma. Disc levels: There is degenerative changes with disc space narrowing and endplate irregularity and anterior osteophyte at C5-C6. Upper chest: There is a 6 x 8 mm ground-glass nodular density in the right apical lung similar to the study of 12/24/2015. This may represent an area of scarring. A indolent/ slow growing neoplastic process is not entirely excluded. Nonemergent chest CT is recommended for complete evaluation of the lungs. Other: None IMPRESSION: 1. No acute intracranial hemorrhage. 2. Moderate age-related atrophy and chronic microvascular ischemic  changes. Old left parietal infarct and encephalomalacia. 3. No acute/traumatic cervical spine pathology. Degenerative changes at C5-C6. 4. Ground-glass nodular density in the right apical lung stable since the study of October 2017. Nonemergent CT of the chest is recommended for complete evaluation of the lungs. Electronically Signed   By: Elgie Collard M.D.   On: 09/01/2016 20:18   Ct Cervical Spine Wo Contrast  Result Date: 09/01/2016 CLINICAL DATA:  71 year old female with fall. EXAM: CT HEAD WITHOUT CONTRAST CT CERVICAL SPINE WITHOUT CONTRAST TECHNIQUE: Multidetector CT imaging of the head and cervical spine was performed following the standard protocol without intravenous contrast. Multiplanar CT image reconstructions of the cervical spine were also generated. COMPARISON:  Head CT dated 04/21/2016 FINDINGS: CT HEAD FINDINGS Brain: There is advanced age-related atrophy and chronic microvascular ischemic changes. There is dilatation of the ventricles out of proportion with the sulci which may represent central volume loss versus normal pressure hydrocephalus. Clinical correlation is recommended. There is a large area of old infarct and encephalomalacia in the left parietal lobe. Small cerebellar infarcts and encephalomalacia noted. There is no acute intracranial hemorrhage. No mass effect or midline shift noted. No extra-axial fluid collection. Vascular: No hyperdense vessel or unexpected calcification. Skull: Normal. Negative for fracture or focal lesion. Sinuses/Orbits: There is mild diffuse mucoperiosteal thickening of paranasal sinuses with near complete opacification of the right maxillary sinus. No air-fluid levels. The remainder of the visualized paranasal sinuses and mastoid air cells are clear. Other: None CT CERVICAL SPINE FINDINGS Alignment: No acute subluxation. There is straightening of normal cervical lordosis which may be positional or due to muscle spasm. Skull base and vertebrae: No acute  fracture. Soft tissues and spinal canal: No prevertebral fluid or swelling. No visible canal hematoma. Disc levels: There is degenerative changes with disc space narrowing and endplate irregularity and anterior osteophyte at C5-C6. Upper chest: There is a 6 x 8 mm ground-glass nodular density in the right apical lung similar to the study of 12/24/2015. This may represent an area of scarring. A indolent/ slow growing neoplastic process is not entirely excluded. Nonemergent chest CT is recommended for complete evaluation of the lungs. Other: None IMPRESSION: 1. No acute intracranial hemorrhage. 2. Moderate age-related atrophy and chronic microvascular ischemic changes. Old left parietal infarct and encephalomalacia. 3. No acute/traumatic cervical spine pathology. Degenerative changes at C5-C6. 4. Ground-glass nodular density in the right apical lung stable since the study of October 2017. Nonemergent CT of the chest is recommended for complete evaluation of the lungs. Electronically Signed   By: Elgie Collard M.D.   On: 09/01/2016 20:18   Dg Hip Unilat W Or Wo Pelvis 2-3 Views Left  Result Date: 09/01/2016 CLINICAL DATA:  Status post fall from wheelchair, with left leg pain. Initial encounter. EXAM: DG HIP (WITH OR WITHOUT PELVIS) 2-3V LEFT COMPARISON:  None. FINDINGS: There is no evidence of fracture or dislocation. Slight cortical irregularity about the left femoral neck is thought to reflect osteophyte  formation. Both femoral heads are seated normally within their respective acetabula. The proximal left femur appears intact. Mild degenerative change is noted at the lower lumbar spine. The sacroiliac joints are unremarkable in appearance. The visualized bowel gas pattern is grossly unremarkable in appearance. IMPRESSION: No definite evidence of fracture or dislocation. Slight cortical irregularity about the left femoral neck is thought to reflect underlying osteophyte formation. Electronically Signed   By:  Roanna Raider M.D.   On: 09/01/2016 20:07    Discharge Exam: Vitals:   09/09/16 2110 09/10/16 0430  BP: 133/87 (!) 141/66  Pulse: 64 69  Resp: 14 14  Temp: 97.7 F (36.5 C) 97.6 F (36.4 C)   Vitals:   09/09/16 0528 09/09/16 1354 09/09/16 2110 09/10/16 0430  BP: (!) 147/93 135/78 133/87 (!) 141/66  Pulse: 75 64 64 69  Resp: 18 14 14 14   Temp: 98.6 F (37 C) 97.9 F (36.6 C) 97.7 F (36.5 C) 97.6 F (36.4 C)  TempSrc: Axillary Axillary Axillary Axillary  SpO2: 99% 98% 98% 98%    General: Awake, nonverbal, lying in fetal position and contracted Cardiovascular: RRR, S1/S2 +, no rubs, no gallops Respiratory: CTA bilaterally, no wheezing, no rhonchi Abdominal: Soft, NT, ND, bowel sounds + Extremities: Trace lower extremity edema, no cyanosis  The results of significant diagnostics from this hospitalization (including imaging, microbiology, ancillary and laboratory) are listed below for reference.     Microbiology: No results found for this or any previous visit (from the past 240 hour(s)).   Labs: BNP (last 3 results) No results for input(s): BNP in the last 8760 hours. Basic Metabolic Panel:  Recent Labs Lab 09/05/16 1940  09/08/16 0118 09/08/16 1434 09/09/16 0237 09/09/16 1457 09/10/16 0224  NA  --   < > 153* 152* 151* 147* 146*  K  --   < > 5.6* 3.7 3.7 3.1* 2.9*  CL  --   < > 123* 114* 118* 113* 111  CO2  --   < > 23 29 25 27 28   GLUCOSE  --   < > 100* 108* 94 150* 112*  BUN  --   < > 22* 17 13 14 10   CREATININE  --   < > 1.07* 1.08* 0.96 0.95 0.90  CALCIUM  --   < > 8.1* 8.5* 8.1* 8.0* 8.2*  MG 3.0*  --   --   --   --   --   --   PHOS 4.6  --   --   --   --   --   --   < > = values in this interval not displayed. Liver Function Tests: No results for input(s): AST, ALT, ALKPHOS, BILITOT, PROT, ALBUMIN in the last 168 hours. No results for input(s): LIPASE, AMYLASE in the last 168 hours. No results for input(s): AMMONIA in the last 168  hours. CBC:  Recent Labs Lab 09/05/16 1310 09/06/16 0327  WBC 8.1 7.5  NEUTROABS 5.3  --   HGB 13.2 12.9  HCT 46.1* 44.2  MCV 98.7 99.5  PLT 151 134*   Cardiac Enzymes: No results for input(s): CKTOTAL, CKMB, CKMBINDEX, TROPONINI in the last 168 hours. BNP: Invalid input(s): POCBNP CBG:  Recent Labs Lab 09/07/16 2216 09/09/16 0835 09/09/16 1201 09/09/16 1734 09/09/16 2118  GLUCAP 127* 96 150* 104* 90   D-Dimer No results for input(s): DDIMER in the last 72 hours. Hgb A1c No results for input(s): HGBA1C in the last 72 hours. Lipid Profile No results  for input(s): CHOL, HDL, LDLCALC, TRIG, CHOLHDL, LDLDIRECT in the last 72 hours. Thyroid function studies No results for input(s): TSH, T4TOTAL, T3FREE, THYROIDAB in the last 72 hours.  Invalid input(s): FREET3 Anemia work up No results for input(s): VITAMINB12, FOLATE, FERRITIN, TIBC, IRON, RETICCTPCT in the last 72 hours. Urinalysis    Component Value Date/Time   COLORURINE AMBER (A) 09/05/2016 1531   APPEARANCEUR CLOUDY (A) 09/05/2016 1531   LABSPEC 1.028 09/05/2016 1531   PHURINE 5.0 09/05/2016 1531   GLUCOSEU NEGATIVE 09/05/2016 1531   HGBUR NEGATIVE 09/05/2016 1531   BILIRUBINUR NEGATIVE 09/05/2016 1531   KETONESUR 5 (A) 09/05/2016 1531   PROTEINUR 100 (A) 09/05/2016 1531   UROBILINOGEN 0.2 10/06/2014 1142   NITRITE NEGATIVE 09/05/2016 1531   LEUKOCYTESUR NEGATIVE 09/05/2016 1531   Sepsis Labs Invalid input(s): PROCALCITONIN,  WBC,  LACTICIDVEN Microbiology No results found for this or any previous visit (from the past 240 hour(s)).   Time coordinating discharge: 25 minutes  SIGNED:  Latrelle DodrillEdwin Silva, MD  [facility] 09/10/2016, 12:41 PM  Pager please text page via  [email or web address] Password [email or web address]

## 2016-09-10 NOTE — Progress Notes (Signed)
Report called to Sylvia at [facility]. 

## 2016-11-11 DEATH — deceased

## 2017-05-02 IMAGING — CT CT HEAD W/O CM
3 of 8 series · 14 of 47 positions shown, 17 images · non-contrast
Comparison: CT scan of January 05, 2016.

CLINICAL DATA: Head laceration after fall today.

EXAM:
CT HEAD WITHOUT CONTRAST
CT CERVICAL SPINE WITHOUT CONTRAST
TECHNIQUE: Multidetector CT imaging of the head and cervical spine was
performed following the standard protocol without intravenous
contrast. Multiplanar CT image reconstructions of the cervical spine
were also generated.

[Series 5: coronal · coronal · 0.34mm/px · 3 of 69 slices shown]
[im 26/69  brain]
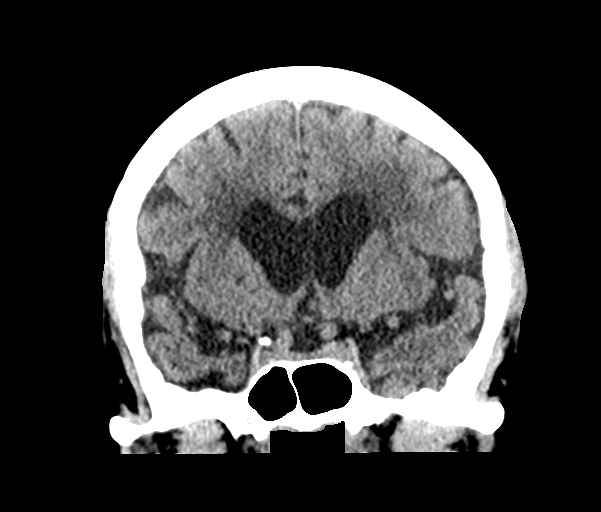
[im 35/69  brain]
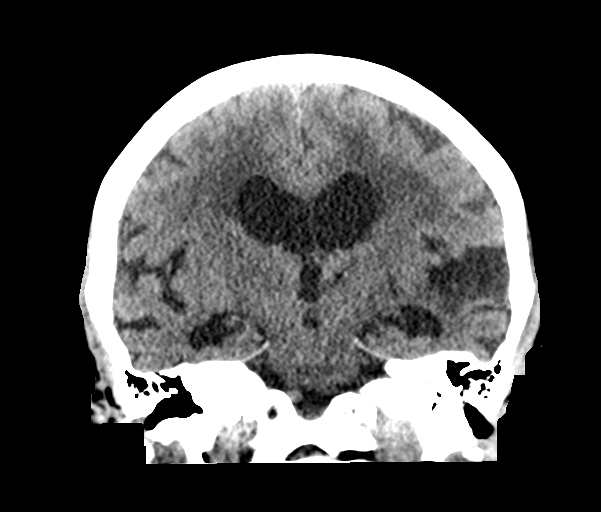
[im 43/69  brain]
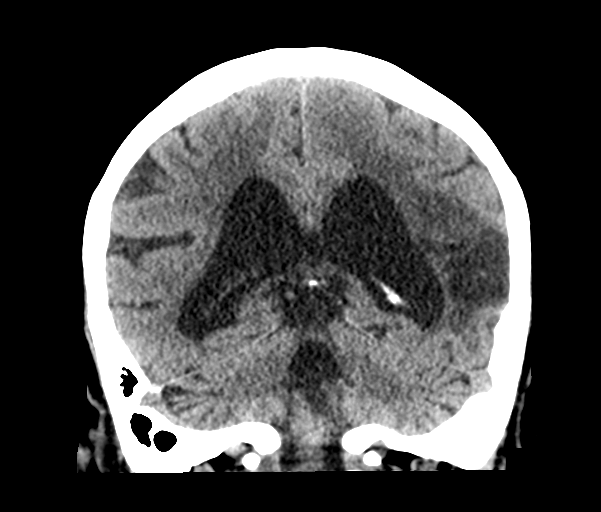

[Series 6: sagittal · sagittal · 0.33mm/px · 2 of 52 slices shown]
[im 18/52  brain]
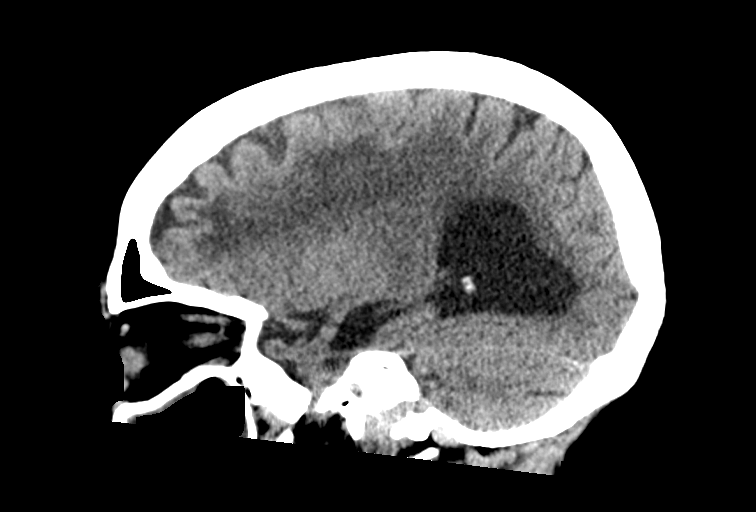
[im 35/52  brain]
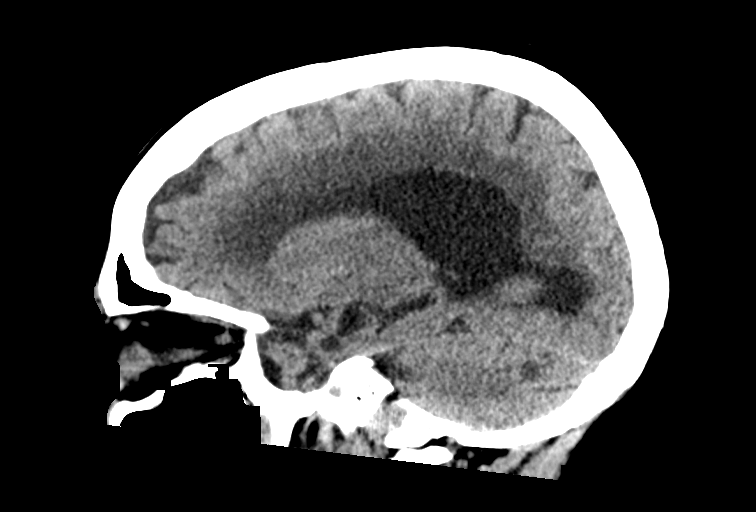

[Series 10: axial recon · axial · 0.23mm/px · z∈[-261,-119]mm · 9 of 101 slices shown, 12 images]
[im 11/101  brain]
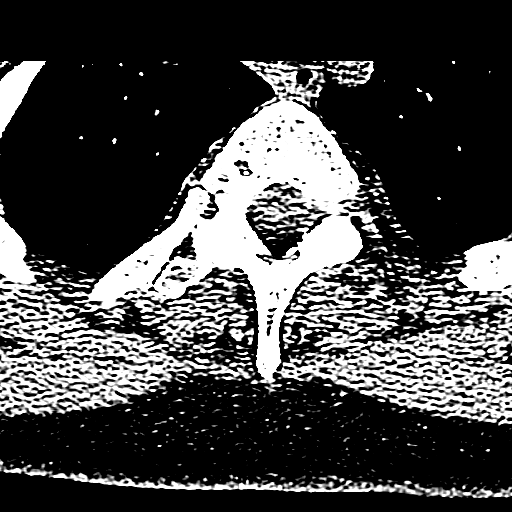
[im 11/101  bone]
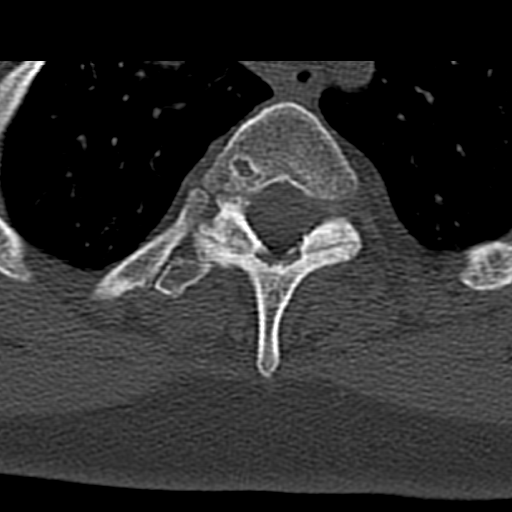
[im 21/101  brain]
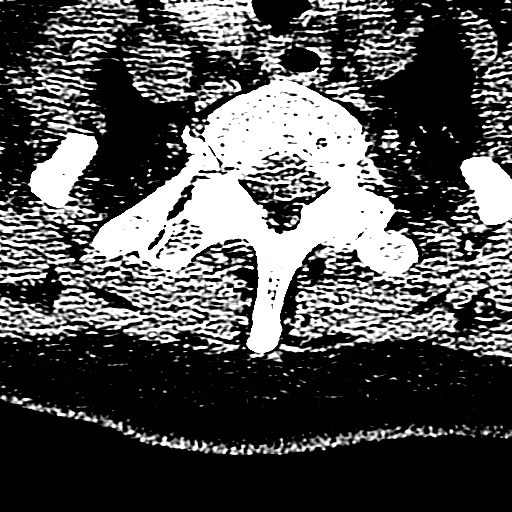
[im 31/101  brain]
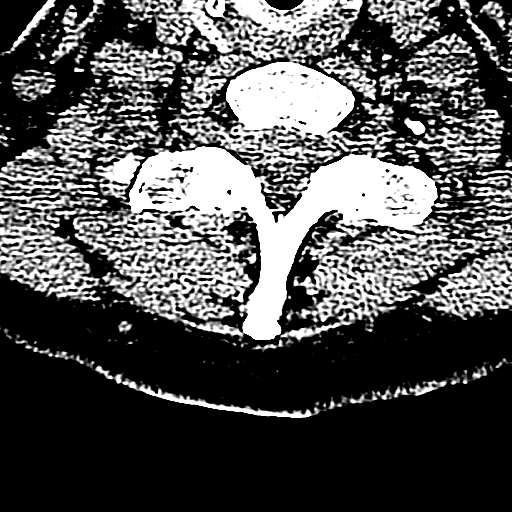
[im 41/101  brain]
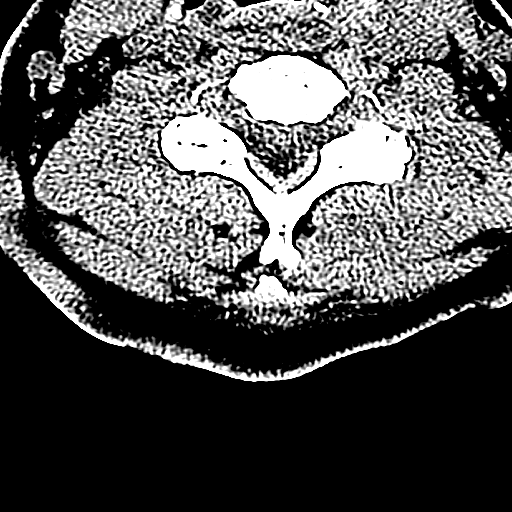
[im 51/101  brain]
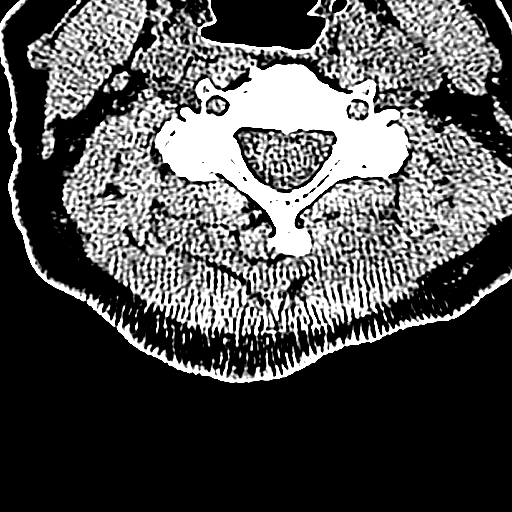
[im 51/101  bone]
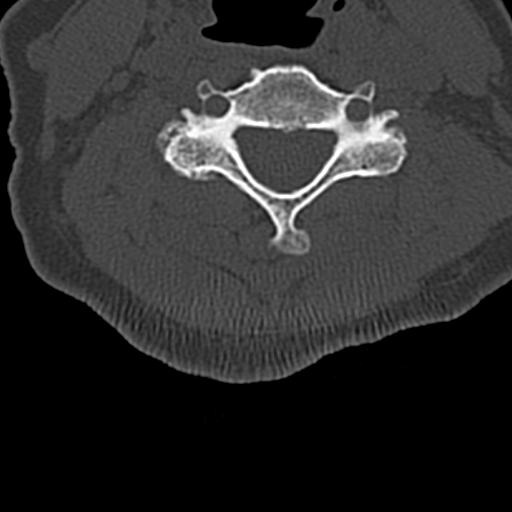
[im 61/101  brain]
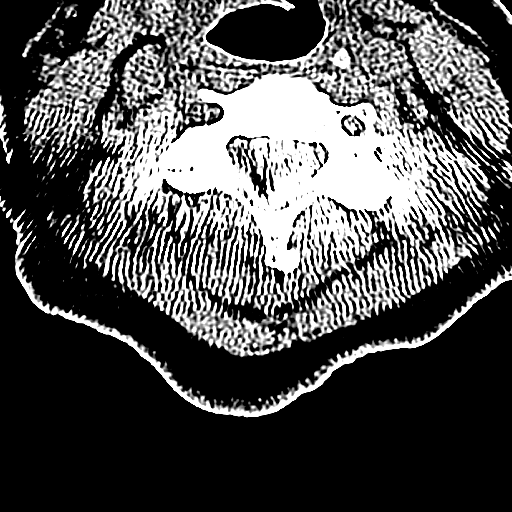
[im 71/101  brain]
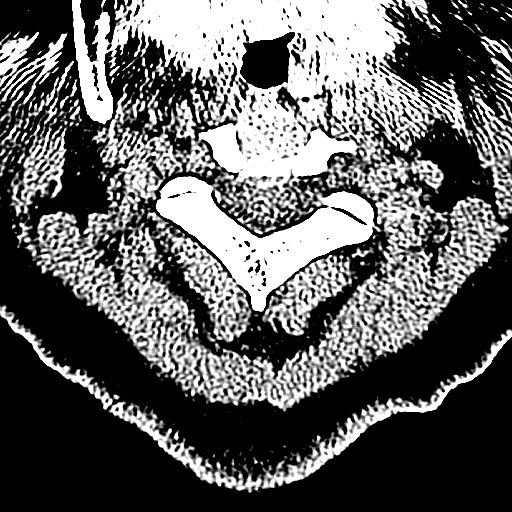
[im 81/101  brain]
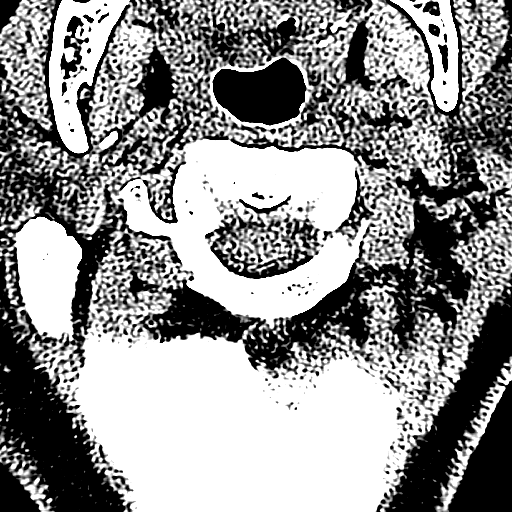
[im 91/101  brain]
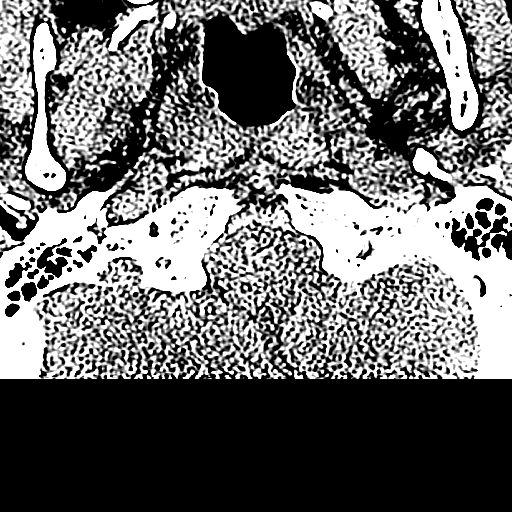
[im 91/101  bone]
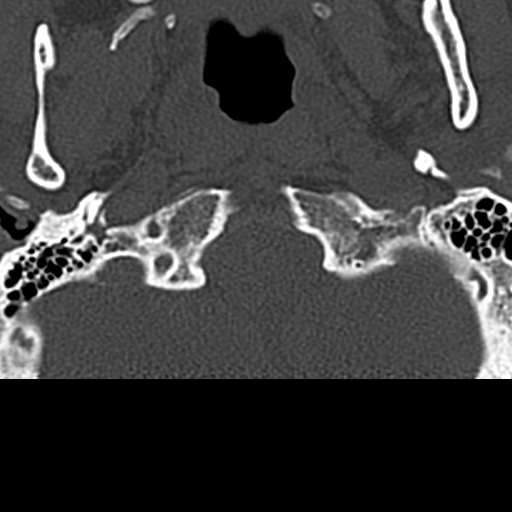

[14 of 47 positions shown; findings below may reference images not displayed]

FINDINGS: CT HEAD FINDINGS

Brain: Left parietal encephalomalacia is noted consistent with old
infarction. Mild chronic ischemic white matter disease is noted. No
mass effect or midline shift is noted. Stable ventricular dilatation
is noted most likely due to surrounding atrophy. There is no
evidence of hemorrhage, mass lesion or acute infarction.

Vascular: Atherosclerosis of carotid siphons is noted.

Skull: Bony calvarium appears intact.

Sinuses/Orbits: Right maxillary sinusitis is noted.

Other: None.

CT CERVICAL SPINE FINDINGS

Alignment: Normal.

Skull base and vertebrae: No definite fracture is noted.

Soft tissues and spinal canal: No soft tissue abnormality is noted.

Disc levels: Moderate degenerative disc disease is noted at C5-6
with anterior osteophyte formation.

Upper chest: Visualized lung apices are unremarkable.

Other: Degenerative changes seen involving posterior facet joints
bilaterally.
IMPRESSION: Old left parietal infarction. Mild chronic ischemic white matter
disease. Mild diffuse cortical atrophy. Right maxillary sinusitis.
No acute intracranial abnormality seen.

Moderate degenerative disc disease is noted at C5-6. No acute
abnormality seen in the cervical spine.
